# Patient Record
Sex: Female | Born: 1985 | Race: White | Hispanic: No | Marital: Single | Smoking: Current every day smoker
Health system: Southern US, Community
[De-identification: ages and names within clinical notes are randomized; demographics above are authoritative.]

## PROBLEM LIST (undated history)

## (undated) DIAGNOSIS — F418 Other specified anxiety disorders: Secondary | ICD-10-CM

## (undated) DIAGNOSIS — F319 Bipolar disorder, unspecified: Secondary | ICD-10-CM

## (undated) DIAGNOSIS — F509 Eating disorder, unspecified: Secondary | ICD-10-CM

## (undated) HISTORY — PX: ANKLE SURGERY: SHX546

## (undated) HISTORY — PX: SPINE SURGERY: SHX786

---

## 2008-11-29 ENCOUNTER — Inpatient Hospital Stay (HOSPITAL_COMMUNITY): Admission: AD | Admit: 2008-11-29 | Discharge: 2008-12-01 | Payer: Self-pay | Admitting: Psychiatry

## 2008-11-29 ENCOUNTER — Ambulatory Visit: Payer: Self-pay | Admitting: Psychiatry

## 2008-11-29 ENCOUNTER — Emergency Department (HOSPITAL_COMMUNITY): Admission: EM | Admit: 2008-11-29 | Discharge: 2008-11-29 | Payer: Self-pay | Admitting: Emergency Medicine

## 2010-10-28 LAB — COMPREHENSIVE METABOLIC PANEL
ALT: 16 U/L (ref 0–35)
Alkaline Phosphatase: 47 U/L (ref 39–117)
BUN: 7 mg/dL (ref 6–23)
CO2: 36 mEq/L — ABNORMAL HIGH (ref 19–32)
GFR calc non Af Amer: 54 mL/min — ABNORMAL LOW (ref >60)
Glucose, Bld: 91 mg/dL (ref 70–99)
Potassium: 3 mEq/L — ABNORMAL LOW (ref 3.5–5.1)
Sodium: 141 mEq/L (ref 135–145)
Total Bilirubin: 1.7 mg/dL — ABNORMAL HIGH (ref 0.3–1.2)

## 2010-10-28 LAB — DIFFERENTIAL
Basophils Absolute: 0 10*3/uL (ref 0.0–0.1)
Basophils Relative: 0 % (ref 0–1)
Eosinophils Absolute: 0.1 10*3/uL (ref 0.0–0.7)
Neutro Abs: 3 10*3/uL (ref 1.7–7.7)
Neutrophils Relative %: 54 % (ref 43–77)

## 2010-10-28 LAB — RAPID URINE DRUG SCREEN, HOSP PERFORMED
Barbiturates: NOT DETECTED
Benzodiazepines: NOT DETECTED
Cocaine: NOT DETECTED

## 2010-10-28 LAB — CBC
HCT: 42.8 % (ref 36.0–46.0)
Hemoglobin: 14.7 g/dL (ref 12.0–15.0)
MCHC: 34.4 g/dL (ref 30.0–36.0)
RBC: 4.75 MIL/uL (ref 3.87–5.11)
RDW: 13.5 % (ref 11.5–15.5)

## 2010-10-28 LAB — URINALYSIS, ROUTINE W REFLEX MICROSCOPIC
Glucose, UA: NEGATIVE mg/dL
Hgb urine dipstick: NEGATIVE
Ketones, ur: 15 mg/dL — AB
Protein, ur: 100 mg/dL — AB
pH: 6 (ref 5.0–8.0)

## 2010-10-28 LAB — URINE MICROSCOPIC-ADD ON

## 2010-10-28 LAB — ETHANOL: Alcohol, Ethyl (B): <5 mg/dL (ref 0–10)

## 2010-10-28 LAB — PREGNANCY, URINE: Preg Test, Ur: NEGATIVE

## 2010-12-02 NOTE — H&P (Signed)
NAMEALELI, NAVEDO              ACCOUNT NO.:  [id number]   MEDICAL RECORD NO.:  [id number]          PATIENT TYPE:  IPS   LOCATION:  0303                          FACILITY:  [facility]   PHYSICIAN:  Jasmine Pang, M.D. DATE OF BIRTH:  03-26-1986   DATE OF ADMISSION:  11/29/2008  DATE OF DISCHARGE:                       PSYCHIATRIC ADMISSION ASSESSMENT   HISTORY OF PRESENT ILLNESS:  The patient is here with a history of an  eating disorder, for the past 5-6 months has been purging and using  laxatives.  Prior to that she was anorexic.  She states that she was  just getting very tired of her parents telling her to eat.  She is here  as her father wanted her to get some help.  She recently lost her mother  about 3 weeks ago.  Also lost her grandmother prior to that and trying  to cope with these losses.  She has never received any prior help for  this.  She knows that it is a problem.  She worries all the time, unable  to sleep due to thinking about food.  She denies that she wants to hurt  herself.  She states that she knows that life is a precious thing.   PAST PSYCHIATRIC HISTORY:  This is her first admission to the [facility].  She has had no prior therapy or has never been on any  medications.   SOCIAL HISTORY:  [age]-year-old female.  She lives with her father.  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   FAMILY HISTORY:  None.   ALCOHOL OR DRUG HISTORY:  She denies any alcohol or drug use.   PRIMARY CARE Alechia Lezama:  None listed.   MEDICAL PROBLEMS:  No acute or chronic health issues.   MEDICATIONS:  None.   DRUG ALLERGIES:  No known allergies.   PHYSICAL EXAM:  This is a healthy-appearing, young female fully assessed  at [facility] Emergency Department.  Her temperature is 98.2, 88 heart rate, 16 respirations, blood pressure  is 117/78.   LABORATORY DATA:  Shows alcohol level less than 5.  Urine drug screen  is  negative.  Urine pregnancy test is negative.  CBC within normal limits.  Potassium is 3.  Urine showed an orange and cloudy appearance with  positive nitrites and positive protein.   MENTAL STATUS EXAM:  This is a fully alert female.  She is cooperative  with good eye-contact, appropriately dressed.  Her speech is clear,  normal pace and tone.  She provides a good history of circumstances that  surrounded this admission.  She seems very sincere.  Thought processes  are coherent and goal directed.  Cognitive function intact.  Her memory  is good.  Judgment and insight appear to be good.   AXIS I:  Eating disorder.  AXIS II:  Deferred.  AXIS III:  No known medical conditions.  AXIS IV:  Problems with education, psychosocial problems, grief.  AXIS V:  Current 45-50.   We will have a family session with her father as soon as possible.  The  patient could benefit from some individual therapy or going to a  residential treatment center.  Will continue to review comorbidities.  The patient may benefit from an antidepressant.  Will have Ambien 5 mg  available for sleep.  We encouraged fluids.  We ordered Gatorade.  The  patient will be in the blue group.  We will continue to, again, provide  a emotional assurance.  Tentative length of stay at this time is three  days.      Landry Corporal, N.P.      Jasmine Pang, M.D.  Electronically Signed    JO/MEDQ  D:  11/30/2008  T:  11/30/2008  Job:  [id number]

## 2010-12-05 NOTE — Discharge Summary (Signed)
Michelle Juarez, Michelle Juarez              ACCOUNT NO.:  [id number]   MEDICAL RECORD NO.:  [id number]          PATIENT TYPE:  IPS   LOCATION:  0303                          FACILITY:  [facility]   PHYSICIAN:  Jasmine Pang, M.D. DATE OF BIRTH:  1986/06/02   DATE OF ADMISSION:  11/29/2008  DATE OF DISCHARGE:  12/01/2008                               DISCHARGE SUMMARY   IDENTIFICATION:  This is the 25 year old single white female who was  admitted on a voluntary basis on Nov 29, 2008.   HISTORY OF PRESENT ILLNESS:  The patient has a history of bulimia and  laxative use.  Her father wanted her to get help and brought her to the  emergency room.  She is under stress because her grandmother and mother  passed away recently.  She has no prior psychiatric treatment and this  is her first [facility] admission.  For further admission information see  psychiatric admission assessment.   Upon admission, the patient was given the diagnoses on:  Axis I: Eating disorder, not otherwise specified; also depressive  disorder, not otherwise specified.  Axis III:  She was also given no disorder diagnosis.   PHYSICAL FINDINGS:  The patient was fully assessed in the [facility] ED  prior to admission here.  There were no acute physical or medical  problems noted.  Admission laboratories, alcohol level was less than  five.  UDS was negative.  Urine pregnancy test was negative.  CBC was  within normal limits.  Urine was cloudy with positive nitrites.   HOSPITAL COURSE:  Upon admission, the patient was started on Ambien 5 mg  p.o. q.h.s. p.r.n. may repeat x1.  She was also started on Ativan 0.5 mg  p.o. q.6 h. p.r.n. anxiety.  In addition, she was started on Gatorade 3-  4 glasses per day.  In individual sessions, the patient was casually  dressed with fair eye contact.  There was psychomotor retardation.  Speech was soft and slow.  Mood was depressed and anxious.  Affect was  consistent with mood.  There was no thought  disorder noted.  On Dec 01, 2008, the patient was anxious to go home.  She was going to have a  family session with her father and grandfather, and we agreed that she  would leave if the session went well.  She was less depressed and less  anxious.  Sleep was good.  The family session was held with our  counselor.  The patient father and grandfather to talk about discharge  plans.  After much discussion, the patient agreed to attend an inpatient  treatment facility for eating disorders.  She admits that she has had  symptoms of an eating disorder for 8 years.  She states she fell  controlled by the eating disorder for the past 3-4 months.  She has  recent grief issues of grandmother and mother dying.  The father and  grandfather felt strongly that she needed to be treated as an inpatient.  Father pointed out she continues to minimize the severity of her problem  and that she still thinks  she is not thin enough.  The patient became  tearful.  At this point, she did appear to have some insight about  positive coping skills like not looking in the mirror, staying away from  the scales, and staying with friends and family rather than being alone  to keep herself from purging.  The patient was stable and felt to be  safe for discharge to go to the inpatient treatment facility for eating  disorders.   DISCHARGE DIAGNOSES:  Axis I: Eating disorder not otherwise specified,  depressive disorder not otherwise specified, and anxiety disorder not  otherwise specified.  Axis II: None.  Axis III: None.  Axis IV: Severe problems with primary support group, recent loss of  mother, and recent loss of grandmother.  ( Also other psychosocial problems and burden of psychiatric illness).  Axis V: Global assessment of functioning was 55 upon discharge.  GAF was  45 upon admission.  GAF highest past year was 60-65.   DISCHARGE/PLAN:  There was no specific activity level or dietary  restriction.    POSTHOSPITAL CARE PLANS:  The patient will go to inpatient eating  disorders unit for follow up treatment for her eating disorder.   DISCHARGE MEDICATIONS:  1. Ambien 5 mg at bedtime as needed for insomnia.  2. Lorazepam 0.5 mg every 6 hours as needed for anxiety.      Jasmine Pang, M.D.  Electronically Signed     BHS/MEDQ  D:  12/03/2008  T:  12/04/2008  Job:  [id number]

## 2011-10-02 ENCOUNTER — Emergency Department (HOSPITAL_BASED_OUTPATIENT_CLINIC_OR_DEPARTMENT_OTHER)
Admission: EM | Admit: 2011-10-02 | Discharge: 2011-10-02 | Disposition: A | Discharge status: Home / Self Care | Payer: BC Managed Care – PPO | Attending: Emergency Medicine | Admitting: Emergency Medicine

## 2011-10-02 ENCOUNTER — Encounter (HOSPITAL_BASED_OUTPATIENT_CLINIC_OR_DEPARTMENT_OTHER): Payer: Self-pay | Admitting: Emergency Medicine

## 2011-10-02 DIAGNOSIS — Z9889 Other specified postprocedural states: Secondary | ICD-10-CM | POA: Insufficient documentation

## 2011-10-02 DIAGNOSIS — Z87891 Personal history of nicotine dependence: Secondary | ICD-10-CM | POA: Insufficient documentation

## 2011-10-02 DIAGNOSIS — K1370 Unspecified lesions of oral mucosa: Secondary | ICD-10-CM | POA: Insufficient documentation

## 2011-10-02 DIAGNOSIS — K1379 Other lesions of oral mucosa: Secondary | ICD-10-CM

## 2011-10-02 HISTORY — DX: Eating disorder, unspecified: F50.9

## 2011-10-02 MED ORDER — CLINDAMYCIN HCL 150 MG PO CAPS: 150.0000 mg | ORAL_CAPSULE | Freq: Four times a day (QID) | ORAL | Status: AC

## 2011-10-02 NOTE — ED Provider Notes (Signed)
History     CSN: [id number]  Arrival date & time 10/02/11  1333   First MD Initiated Contact with Patient 10/02/11 1402      Chief Complaint  Patient presents with  . Mouth Lesions    (Consider location/radiation/quality/duration/timing/severity/associated sxs/prior treatment) HPI Comments: Pt states that she had surgery 2 days ago on her ankle and she woke up this morning and noticed that she had some swelling to her left upper mouth with pain to the area  Patient is a 26 y.o. female presenting with mouth sores. The history is provided by the patient. No language interpreter was used.  Mouth Lesions  The current episode started today. The problem occurs continuously. The problem has been unchanged. The problem is moderate. Associated symptoms include headaches and mouth sores.    Past Medical History  Diagnosis Date  . Eating disorder     Past Surgical History  Procedure Date  . Ankle surgery     No family history on file.  History  Substance Use Topics  . Smoking status: Former Games developer  . Smokeless tobacco: Not on file  . Alcohol Use: No    OB History    Grav Para Term Preterm Abortions TAB SAB Ect Mult Living                  Review of Systems  Constitutional: Negative.   HENT: Positive for mouth sores.   Respiratory: Negative.   Cardiovascular: Negative.   Neurological: Positive for headaches.    Allergies  Review of patient's allergies indicates not on file.  Home Medications   Current Outpatient Rx  Name Route Sig Dispense Refill  . FUROSEMIDE 20 MG PO TABS Oral Take 20 mg by mouth 2 (two) times daily.    Marland Kitchen GABAPENTIN 400 MG PO CAPS Oral Take 400 mg by mouth 4 (four) times daily.    . SERTRALINE HCL 25 MG PO TABS Oral Take 25 mg by mouth daily.    Marland Kitchen SPIRONOLACTONE 25 MG PO TABS Oral Take 25 mg by mouth daily.    . TRAZODONE HCL 100 MG PO TABS Oral Take 100 mg by mouth at bedtime.      BP 111/70  Pulse 80  Temp(Src) 98.3 F (36.8 C) (Oral)   Resp 18  SpO2 97%  Physical Exam  Nursing note and vitals reviewed. Constitutional: She appears well-developed and well-nourished.  HENT:       Pt has swelling noted to the left upper palate  Eyes: Conjunctivae and EOM are normal.  Cardiovascular: Normal rate and regular rhythm.   Pulmonary/Chest: Effort normal and breath sounds normal. No respiratory distress.    ED Course  Procedures (including critical care time)  Labs Reviewed - No data to display No results found.   1. Mouth sore       MDM  Will treat for possible beginning of abscess:pt was unsure of intubation        Teressa Lower, NP 10/02/11 1429

## 2011-10-02 NOTE — ED Notes (Addendum)
Pt. Doesn't know the name of the antibiotic she is taking, but father says it is a green tablet. Pt. States she is in [place].

## 2011-10-02 NOTE — ED Notes (Signed)
Pt. C/o roof of mouth there is a lump. States she had foot surgery 2 days ago and is taking an antibiotic and hydro-codon. Mouth is painful in that specific area. Denies itching. Pt. States pressure in her head as well

## 2011-10-04 NOTE — ED Provider Notes (Signed)
Medical screening examination/treatment/procedure(s) were performed by non-physician practitioner and as supervising physician I was immediately available for consultation/collaboration.   Steffani Dionisio, MD 10/04/11 2123 

## 2012-10-10 ENCOUNTER — Encounter (HOSPITAL_COMMUNITY): Payer: Self-pay | Admitting: Nurse Practitioner

## 2012-10-10 ENCOUNTER — Emergency Department (HOSPITAL_COMMUNITY)
Admission: EM | Admit: 2012-10-10 | Discharge: 2012-10-10 | Disposition: A | Discharge status: Home / Self Care | Payer: BC Managed Care – PPO | Attending: Emergency Medicine | Admitting: Emergency Medicine

## 2012-10-10 DIAGNOSIS — Z8659 Personal history of other mental and behavioral disorders: Secondary | ICD-10-CM | POA: Insufficient documentation

## 2012-10-10 DIAGNOSIS — F319 Bipolar disorder, unspecified: Secondary | ICD-10-CM | POA: Insufficient documentation

## 2012-10-10 DIAGNOSIS — F419 Anxiety disorder, unspecified: Secondary | ICD-10-CM

## 2012-10-10 DIAGNOSIS — F4522 Body dysmorphic disorder: Secondary | ICD-10-CM

## 2012-10-10 DIAGNOSIS — E876 Hypokalemia: Secondary | ICD-10-CM | POA: Insufficient documentation

## 2012-10-10 DIAGNOSIS — F4521 Hypochondriasis: Secondary | ICD-10-CM | POA: Insufficient documentation

## 2012-10-10 DIAGNOSIS — Z3202 Encounter for pregnancy test, result negative: Secondary | ICD-10-CM | POA: Insufficient documentation

## 2012-10-10 DIAGNOSIS — Z79899 Other long term (current) drug therapy: Secondary | ICD-10-CM | POA: Insufficient documentation

## 2012-10-10 DIAGNOSIS — F411 Generalized anxiety disorder: Secondary | ICD-10-CM | POA: Insufficient documentation

## 2012-10-10 DIAGNOSIS — F17200 Nicotine dependence, unspecified, uncomplicated: Secondary | ICD-10-CM | POA: Insufficient documentation

## 2012-10-10 HISTORY — DX: Other specified anxiety disorders: F41.8

## 2012-10-10 HISTORY — DX: Bipolar disorder, unspecified: F31.9

## 2012-10-10 LAB — BASIC METABOLIC PANEL
BUN: 5 mg/dL — ABNORMAL LOW (ref 6–23)
CO2: 34 mEq/L — ABNORMAL HIGH (ref 19–32)
Calcium: 9.4 mg/dL (ref 8.4–10.5)
Glucose, Bld: 91 mg/dL (ref 70–99)
Sodium: 137 mEq/L (ref 135–145)

## 2012-10-10 LAB — URINALYSIS, ROUTINE W REFLEX MICROSCOPIC
Bilirubin Urine: NEGATIVE
Glucose, UA: NEGATIVE mg/dL
Hgb urine dipstick: NEGATIVE
Nitrite: NEGATIVE
Specific Gravity, Urine: 1.013 (ref 1.005–1.030)
pH: 8.5 — ABNORMAL HIGH (ref 5.0–8.0)

## 2012-10-10 LAB — RAPID URINE DRUG SCREEN, HOSP PERFORMED
Barbiturates: NOT DETECTED
Benzodiazepines: NOT DETECTED
Cocaine: NOT DETECTED
Opiates: NOT DETECTED

## 2012-10-10 LAB — CBC
HCT: 36.9 % (ref 36.0–46.0)
Hemoglobin: 12.2 g/dL (ref 12.0–15.0)
MCH: 28.1 pg (ref 26.0–34.0)
RBC: 4.34 MIL/uL (ref 3.87–5.11)

## 2012-10-10 LAB — URINE MICROSCOPIC-ADD ON

## 2012-10-10 MED ORDER — POTASSIUM CHLORIDE CRYS ER 20 MEQ PO TBCR
40.0000 meq | EXTENDED_RELEASE_TABLET | Freq: Once | ORAL | Status: AC
  Administered 2012-10-10: 40 meq via ORAL
  Filled 2012-10-10: qty 2

## 2012-10-10 MED ORDER — SODIUM CHLORIDE 0.9 % IV BOLUS (SEPSIS): 1000.0000 mL | Freq: Once | INTRAVENOUS | Status: DC

## 2012-10-10 MED ORDER — POTASSIUM CHLORIDE 10 MEQ/100ML IV SOLN: 10.0000 meq | Freq: Once | INTRAVENOUS | Status: DC

## 2012-10-10 MED ORDER — POTASSIUM CHLORIDE ER 10 MEQ PO TBCR: 20.0000 meq | EXTENDED_RELEASE_TABLET | Freq: Two times a day (BID) | ORAL | Status: DC

## 2012-10-10 NOTE — ED Notes (Signed)
YNW:[id number]<[id number]> Expected date:<BR> Expected time:<BR> Means of arrival:<BR> Comments:<BR> EMS - Syncope **[id number]**

## 2012-10-10 NOTE — ED Provider Notes (Signed)
Michelle Juarez S 8:00 PM patient discussed in sign out with [employer]. Patient with known psychiatric disorders presents to the emergency room from therapist's office for concerns of altered behavior and mental status change. Patient states that she was just feeling in a bad mood and did not lose consciousness and all are heard other people trying to talk to her and arouse her but she did not feel like talking. She also states that she has been very tired due to poor sleep and this had caused her to want to lay down and rest at the office. Currently she is awake and alert calm and appropriate. She has no complaints.  8:30 PM patient found to have low potassium of 2.6. She has history of hypokalemia in the past as well related to her anorexia and bulimia eating disorders. Potassium ordered. No significant EKG changes and no U waves. Patient's labs otherwise unremarkable. Patient has refused IV potassium states that she does not like this from experiences in the past. Patient has been given oral potassium supplements. We'll also continue oral potassium at home for the next several days. She advised to call PCP to recheck potassium level later this week.       Date: 10/10/2012  Rate: 70  Rhythm: normal sinus rhythm  QRS Axis: normal  Intervals: normal  ST/T Wave abnormalities: nonspecific ST/T changes  Conduction Disutrbances:none  Narrative Interpretation:   Old EKG Reviewed: none available    Angus Seller, PA-C 10/10/12 2109

## 2012-10-10 NOTE — ED Notes (Addendum)
Per EMS:  Pt was in therapy and fell asleep in fetal position; staff reported that they were unable to wake pt, however, pt states that she was sleeping. Speech slurred initially; slurred speech resolved and slow speech ensued en route; pt alert and oriented x 4 per EMS. Pt denies falling.  EKG unremarkable.  Pt reported that she took Klonipin this morning as prescribed.  BP 104/80, HR 100, 100%, CBG 93.  20g in left hand.  Addendum:  Pt stated that she took 1mg   Klonopin at 8 AM this morning as prescribed (as needed).  Pt states that she was feeling anxious this morning because she was going to see her therapist, Lurena Joiner, for body fat measurements; pt talked with therapist and decided to do measurements tomorrow.  Pt has not had a period in years due to eating disorder. Pt had a pair of tweezers and she was picking at a callous on hand; tweezer removed from pt with her permission. Left thumb is taped up due to accident 3 days ago from cutting vegetables.    Discussed the above with Jaci Carrel, PA and pt.

## 2012-10-10 NOTE — ED Provider Notes (Signed)
History     CSN: [id number]  Arrival date & time 10/10/12  1753   First MD Initiated Contact with Patient 10/10/12 1826      Chief Complaint  Patient presents with  . Altered Mental Status    (Consider location/radiation/quality/duration/timing/severity/associated sxs/prior treatment) HPI Comments: Michelle Juarez is a 27 y.o. female with a history of depression and anorexia presents emergency department from her therapist office after having an acute episode of altered mental status.  Per EMS patient was sent from [facility] for evaluation of altered mental status.  Patient was found in fetal position with slurred speech that has since resolved.  Patient reports taking 1 mg of Klonopin this morning prior to her therapist appointment because she becomes anxious before these meetings.  Prior to the event patient reports that she just exposed herself emotionally revealing personal things to therapist that became too hard to deal with emotionally.  Patient then walked away and sat in fetal position to fall asleep.  Patient reports that she has a followup appointment with her therapist tomorrow at 11 a.m. and that her psychiatrist following that as well as another therapy appointment scheduled for Thursday 11 a.m.  Patient denies any current suicidal ideations, plan or past attempts.  Patient states that she just wants to go home.  No other complaints at this time.  The history is provided by the patient.    Past Medical History  Diagnosis Date  . Eating disorder   . Depression with anxiety   . Bipolar 1 disorder     Past Surgical History  Procedure Laterality Date  . Ankle surgery      History reviewed. No pertinent family history.  History  Substance Use Topics  . Smoking status: Current Every Day Smoker -- 0.25 packs/day  . Smokeless tobacco: Never Used  . Alcohol Use: No    OB History   Grav Para Term Preterm Abortions TAB SAB Ect Mult Living            Review of Systems  All other systems reviewed and are negative.    Allergies  Review of patient's allergies indicates no known allergies.  Home Medications   Current Outpatient Rx  Name  Route  Sig  Dispense  Refill  . Amphetamine-Dextroamphetamine (AMPHETAMINE SALT COMBO PO)   Oral   Take 25 mg by mouth daily.         . furosemide (LASIX) 20 MG tablet   Oral   Take 20 mg by mouth 2 (two) times daily.         Marland Kitchen gabapentin (NEURONTIN) 400 MG capsule   Oral   Take 400 mg by mouth 4 (four) times daily.         . sertraline (ZOLOFT) 100 MG tablet   Oral   Take 200 mg by mouth daily.           BP 106/75  Pulse 71  Temp(Src) 98.3 F (36.8 C) (Oral)  Resp 17  SpO2 100%  Physical Exam  Nursing note and vitals reviewed. Constitutional: She is oriented to person, place, and time. She appears well-developed. No distress.  Patient not in visible distress, very thin appearing   HENT:  Head: Normocephalic and atraumatic.  Eyes: Conjunctivae and EOM are normal. Pupils are equal, round, and reactive to light.  Neck: Normal range of motion. Neck supple. Normal carotid pulses, no hepatojugular reflux and no JVD present. Carotid bruit is not present.  No carotid bruits  Cardiovascular:  RRR, no aberrancy on auscultation, intact distal pulses no pitting edema bilaterally.  Pulmonary/Chest: Effort normal and breath sounds normal. No respiratory distress.  Lungs clear to auscultation bilaterally  Abdominal: Soft. She exhibits no distension. There is no tenderness.  Nonpulsatile aorta  Musculoskeletal: Normal range of motion. She exhibits no tenderness.  Neurological: She is alert and oriented to person, place, and time. She displays a negative Romberg sign.  Cranial nerves III through XII intact, normal coordination, negative Romberg, strength 5/5 bilaterally. No ataxia  Skin: Skin is warm and dry. No pallor.  Psychiatric: She has a normal mood and affect. Her  behavior is normal.    ED Course  Procedures (including critical care time)  Labs Reviewed - No data to display No results found.   No diagnosis found.    MDM  Momentary alt mental status with possible etiology of emotional stress  Pt sent to ER from [facility] after being found in fetal position w difficulty arousing.  No focal deficits on exam.  Labs and and ECG ordered. Pt evaluated by  [facility] who has had her sign a contract for safety.  Suicide risk estimation is low patient denies any suicidal ideations has no past attempts and is currently being evaluated by both a therapist and a psychiatrist of which she is scheduled to see tomorrow.  As patient has appropriate followup I anticipate likely discharge if labs and ECG result without acute abnormalities.  Care resumed by oncoming provider.        Jaci Carrel, [place] 10/10/12 2004

## 2012-10-11 NOTE — ED Provider Notes (Signed)
Medical screening examination/treatment/procedure(s) were performed by non-physician practitioner and as supervising physician I was immediately available for consultation/collaboration.  Toy Baker, MD 10/11/12 [phone]

## 2012-10-11 NOTE — ED Provider Notes (Signed)
Medical screening examination/treatment/procedure(s) were performed by non-physician practitioner and as supervising physician I was immediately available for consultation/collaboration.  Danashia Landers T Undrea Archbold, MD 10/11/12 1543 

## 2012-10-12 LAB — URINE CULTURE

## 2016-02-13 ENCOUNTER — Emergency Department (HOSPITAL_COMMUNITY): Payer: BLUE CROSS/BLUE SHIELD

## 2016-02-13 ENCOUNTER — Inpatient Hospital Stay (HOSPITAL_COMMUNITY)
Admission: EM | Admit: 2016-02-13 | Discharge: 2016-02-25 | DRG: 917 | Disposition: A | Discharge status: Other Acute Inpatient Hospital | Payer: BLUE CROSS/BLUE SHIELD | Attending: Internal Medicine | Admitting: Internal Medicine

## 2016-02-13 ENCOUNTER — Encounter (HOSPITAL_COMMUNITY): Payer: Self-pay | Admitting: *Deleted

## 2016-02-13 DIAGNOSIS — F4310 Post-traumatic stress disorder, unspecified: Secondary | ICD-10-CM | POA: Diagnosis present

## 2016-02-13 DIAGNOSIS — F17210 Nicotine dependence, cigarettes, uncomplicated: Secondary | ICD-10-CM | POA: Diagnosis present

## 2016-02-13 DIAGNOSIS — Z915 Personal history of self-harm: Secondary | ICD-10-CM | POA: Diagnosis not present

## 2016-02-13 DIAGNOSIS — Z681 Body mass index (BMI) 19 or less, adult: Secondary | ICD-10-CM

## 2016-02-13 DIAGNOSIS — T426X4A Poisoning by other antiepileptic and sedative-hypnotic drugs, undetermined, initial encounter: Secondary | ICD-10-CM | POA: Diagnosis not present

## 2016-02-13 DIAGNOSIS — R402112 Coma scale, eyes open, never, at arrival to emergency department: Secondary | ICD-10-CM | POA: Diagnosis present

## 2016-02-13 DIAGNOSIS — F319 Bipolar disorder, unspecified: Secondary | ICD-10-CM | POA: Diagnosis present

## 2016-02-13 DIAGNOSIS — J9601 Acute respiratory failure with hypoxia: Secondary | ICD-10-CM | POA: Diagnosis present

## 2016-02-13 DIAGNOSIS — D649 Anemia, unspecified: Secondary | ICD-10-CM | POA: Diagnosis present

## 2016-02-13 DIAGNOSIS — J9600 Acute respiratory failure, unspecified whether with hypoxia or hypercapnia: Secondary | ICD-10-CM | POA: Diagnosis present

## 2016-02-13 DIAGNOSIS — K5900 Constipation, unspecified: Secondary | ICD-10-CM | POA: Diagnosis not present

## 2016-02-13 DIAGNOSIS — M542 Cervicalgia: Secondary | ICD-10-CM | POA: Diagnosis not present

## 2016-02-13 DIAGNOSIS — Z978 Presence of other specified devices: Secondary | ICD-10-CM

## 2016-02-13 DIAGNOSIS — R402212 Coma scale, best verbal response, none, at arrival to emergency department: Secondary | ICD-10-CM | POA: Diagnosis present

## 2016-02-13 DIAGNOSIS — T6591XA Toxic effect of unspecified substance, accidental (unintentional), initial encounter: Principal | ICD-10-CM | POA: Diagnosis present

## 2016-02-13 DIAGNOSIS — Z79899 Other long term (current) drug therapy: Secondary | ICD-10-CM

## 2016-02-13 DIAGNOSIS — Z888 Allergy status to other drugs, medicaments and biological substances status: Secondary | ICD-10-CM | POA: Diagnosis not present

## 2016-02-13 DIAGNOSIS — F5000 Anorexia nervosa, unspecified: Secondary | ICD-10-CM | POA: Diagnosis not present

## 2016-02-13 DIAGNOSIS — F509 Eating disorder, unspecified: Secondary | ICD-10-CM | POA: Diagnosis present

## 2016-02-13 DIAGNOSIS — R402312 Coma scale, best motor response, none, at arrival to emergency department: Secondary | ICD-10-CM | POA: Diagnosis present

## 2016-02-13 DIAGNOSIS — R4182 Altered mental status, unspecified: Secondary | ICD-10-CM | POA: Diagnosis present

## 2016-02-13 DIAGNOSIS — G9340 Encephalopathy, unspecified: Secondary | ICD-10-CM | POA: Diagnosis not present

## 2016-02-13 DIAGNOSIS — T50902D Poisoning by unspecified drugs, medicaments and biological substances, intentional self-harm, subsequent encounter: Secondary | ICD-10-CM | POA: Diagnosis not present

## 2016-02-13 DIAGNOSIS — F418 Other specified anxiety disorders: Secondary | ICD-10-CM | POA: Diagnosis present

## 2016-02-13 DIAGNOSIS — Z791 Long term (current) use of non-steroidal anti-inflammatories (NSAID): Secondary | ICD-10-CM | POA: Diagnosis not present

## 2016-02-13 DIAGNOSIS — G92 Toxic encephalopathy: Secondary | ICD-10-CM | POA: Diagnosis present

## 2016-02-13 DIAGNOSIS — Z8659 Personal history of other mental and behavioral disorders: Secondary | ICD-10-CM

## 2016-02-13 DIAGNOSIS — F331 Major depressive disorder, recurrent, moderate: Secondary | ICD-10-CM | POA: Diagnosis not present

## 2016-02-13 DIAGNOSIS — T1491 Suicide attempt: Secondary | ICD-10-CM | POA: Diagnosis not present

## 2016-02-13 DIAGNOSIS — J9811 Atelectasis: Secondary | ICD-10-CM | POA: Diagnosis present

## 2016-02-13 DIAGNOSIS — G629 Polyneuropathy, unspecified: Secondary | ICD-10-CM | POA: Diagnosis present

## 2016-02-13 DIAGNOSIS — F1010 Alcohol abuse, uncomplicated: Secondary | ICD-10-CM | POA: Diagnosis not present

## 2016-02-13 DIAGNOSIS — D72829 Elevated white blood cell count, unspecified: Secondary | ICD-10-CM | POA: Diagnosis present

## 2016-02-13 DIAGNOSIS — T50901A Poisoning by unspecified drugs, medicaments and biological substances, accidental (unintentional), initial encounter: Secondary | ICD-10-CM

## 2016-02-13 DIAGNOSIS — F411 Generalized anxiety disorder: Secondary | ICD-10-CM | POA: Diagnosis not present

## 2016-02-13 DIAGNOSIS — T50902A Poisoning by unspecified drugs, medicaments and biological substances, intentional self-harm, initial encounter: Secondary | ICD-10-CM | POA: Diagnosis not present

## 2016-02-13 DIAGNOSIS — T50904A Poisoning by unspecified drugs, medicaments and biological substances, undetermined, initial encounter: Secondary | ICD-10-CM | POA: Diagnosis not present

## 2016-02-13 DIAGNOSIS — F4312 Post-traumatic stress disorder, chronic: Secondary | ICD-10-CM | POA: Diagnosis not present

## 2016-02-13 LAB — I-STAT ARTERIAL BLOOD GAS, ED
ACID-BASE DEFICIT: 3 mmol/L — AB (ref 0.0–2.0)
Acid-base deficit: 4 mmol/L — ABNORMAL HIGH (ref 0.0–2.0)
Bicarbonate: 23.7 mEq/L (ref 20.0–24.0)
Bicarbonate: 22.6 mEq/L (ref 20.0–24.0)
O2 SAT: 99 %
O2 Saturation: 84 %
PH ART: 7.32 — AB (ref 7.350–7.450)
TCO2: 25 mmol/L (ref 0–100)
TCO2: 24 mmol/L (ref 0–100)
pCO2 arterial: 48.2 mmHg — ABNORMAL HIGH (ref 35.0–45.0)
pCO2 arterial: 43.5 mmHg (ref 35.0–45.0)
pH, Arterial: 7.299 — ABNORMAL LOW (ref 7.350–7.450)
pO2, Arterial: 141 mmHg — ABNORMAL HIGH (ref 80.0–100.0)
pO2, Arterial: 51 mmHg — ABNORMAL LOW (ref 80.0–100.0)

## 2016-02-13 LAB — RAPID URINE DRUG SCREEN, HOSP PERFORMED
Amphetamines: NOT DETECTED
BARBITURATES: NOT DETECTED
Benzodiazepines: NOT DETECTED
Cocaine: NOT DETECTED
Opiates: NOT DETECTED
Tetrahydrocannabinol: NOT DETECTED

## 2016-02-13 LAB — URINALYSIS, ROUTINE W REFLEX MICROSCOPIC
BILIRUBIN URINE: NEGATIVE
GLUCOSE, UA: NEGATIVE mg/dL
KETONES UR: NEGATIVE mg/dL
NITRITE: NEGATIVE
PH: 7 (ref 5.0–8.0)
Protein, ur: NEGATIVE mg/dL
Specific Gravity, Urine: 1.008 (ref 1.005–1.030)

## 2016-02-13 LAB — ACETAMINOPHEN LEVEL: Acetaminophen (Tylenol), Serum: <10 ug/mL — ABNORMAL LOW (ref 10–30)

## 2016-02-13 LAB — CBC WITH DIFFERENTIAL/PLATELET
Basophils Absolute: 0 10*3/uL (ref 0.0–0.1)
Basophils Relative: 0 %
Eosinophils Absolute: 0.2 10*3/uL (ref 0.0–0.7)
Eosinophils Relative: 2 %
HCT: 35 % — ABNORMAL LOW (ref 36.0–46.0)
Hemoglobin: 11.2 g/dL — ABNORMAL LOW (ref 12.0–15.0)
Lymphocytes Relative: 23 %
Lymphs Abs: 2 10*3/uL (ref 0.7–4.0)
MCH: 28.4 pg (ref 26.0–34.0)
MCHC: 32 g/dL (ref 30.0–36.0)
MCV: 88.6 fL (ref 78.0–100.0)
Monocytes Absolute: 1.1 10*3/uL — ABNORMAL HIGH (ref 0.1–1.0)
Monocytes Relative: 12 %
Neutro Abs: 5.4 10*3/uL (ref 1.7–7.7)
Neutrophils Relative %: 63 %
Platelets: 224 10*3/uL (ref 150–400)
RBC: 3.95 MIL/uL (ref 3.87–5.11)
RDW: 15.5 % (ref 11.5–15.5)
WBC: 8.6 10*3/uL (ref 4.0–10.5)

## 2016-02-13 LAB — URINE MICROSCOPIC-ADD ON

## 2016-02-13 LAB — TRIGLYCERIDES: Triglycerides: 138 mg/dL (ref <150)

## 2016-02-13 LAB — COMPREHENSIVE METABOLIC PANEL
ALT: 14 U/L (ref 14–54)
AST: 21 U/L (ref 15–41)
Albumin: 3 g/dL — ABNORMAL LOW (ref 3.5–5.0)
Alkaline Phosphatase: 44 U/L (ref 38–126)
Anion gap: 4 — ABNORMAL LOW (ref 5–15)
BUN: 6 mg/dL (ref 6–20)
CO2: 24 mmol/L (ref 22–32)
Calcium: 8.6 mg/dL — ABNORMAL LOW (ref 8.9–10.3)
Chloride: 109 mmol/L (ref 101–111)
Creatinine, Ser: 0.98 mg/dL (ref 0.44–1.00)
GFR calc Af Amer: >60 mL/min (ref >60)
GFR calc non Af Amer: >60 mL/min (ref >60)
Glucose, Bld: 71 mg/dL (ref 65–99)
Potassium: 3.5 mmol/L (ref 3.5–5.1)
Sodium: 137 mmol/L (ref 135–145)
Total Bilirubin: 0.2 mg/dL — ABNORMAL LOW (ref 0.3–1.2)
Total Protein: 5.4 g/dL — ABNORMAL LOW (ref 6.5–8.1)

## 2016-02-13 LAB — POC URINE PREG, ED: PREG TEST UR: NEGATIVE

## 2016-02-13 LAB — CBG MONITORING, ED: GLUCOSE-CAPILLARY: 81 mg/dL (ref 65–99)

## 2016-02-13 LAB — I-STAT CG4 LACTIC ACID, ED
LACTIC ACID, VENOUS: 1.95 mmol/L — AB (ref 0.5–1.9)
Lactic Acid, Venous: 1.83 mmol/L (ref 0.5–1.9)

## 2016-02-13 LAB — SALICYLATE LEVEL

## 2016-02-13 LAB — ETHANOL: ALCOHOL ETHYL (B): 77 mg/dL — AB (ref <5)

## 2016-02-13 MED ORDER — PROPOFOL 1000 MG/100ML IV EMUL
INTRAVENOUS | Status: AC
  Filled 2016-02-13: qty 100

## 2016-02-13 MED ORDER — PROPOFOL 1000 MG/100ML IV EMUL: 5.0000 ug/kg/min | Freq: Once | INTRAVENOUS | Status: DC

## 2016-02-13 MED ORDER — SODIUM CHLORIDE 0.9 % IV BOLUS (SEPSIS)
1000.0000 mL | Freq: Once | INTRAVENOUS | Status: AC
  Administered 2016-02-13: 1000 mL via INTRAVENOUS

## 2016-02-13 MED ORDER — NALOXONE HCL 0.4 MG/ML IJ SOLN: 0.4000 mg | Freq: Once | INTRAMUSCULAR | Status: DC

## 2016-02-13 MED ORDER — MIDAZOLAM HCL 2 MG/2ML IJ SOLN
2.0000 mg | Freq: Once | INTRAMUSCULAR | Status: AC
  Administered 2016-02-13: 2 mg via INTRAVENOUS

## 2016-02-13 MED ORDER — FENTANYL CITRATE (PF) 100 MCG/2ML IJ SOLN
100.0000 ug | Freq: Once | INTRAMUSCULAR | Status: AC
  Administered 2016-02-13: 100 ug via INTRAVENOUS

## 2016-02-13 MED ORDER — SODIUM CHLORIDE 0.9 % IV SOLN: 250.0000 mL | INTRAVENOUS | Status: DC | PRN

## 2016-02-13 MED ORDER — SODIUM CHLORIDE 0.9 % IV SOLN
INTRAVENOUS | Status: DC
  Administered 2016-02-13 – 2016-02-15 (×3): via INTRAVENOUS

## 2016-02-13 MED ORDER — KETAMINE HCL-SODIUM CHLORIDE 100-0.9 MG/10ML-% IV SOSY
1.5000 mg/kg | PREFILLED_SYRINGE | Freq: Once | INTRAVENOUS | Status: DC
  Filled 2016-02-13: qty 10

## 2016-02-13 MED ORDER — FENTANYL CITRATE (PF) 100 MCG/2ML IJ SOLN: 100.0000 ug | INTRAMUSCULAR | Status: DC | PRN

## 2016-02-13 MED ORDER — KETAMINE HCL 10 MG/ML IJ SOLN
INTRAMUSCULAR | Status: AC | PRN
  Administered 2016-02-13: 100 mg via INTRAVENOUS

## 2016-02-13 MED ORDER — MIDAZOLAM HCL 2 MG/2ML IJ SOLN
INTRAMUSCULAR | Status: AC
  Filled 2016-02-13: qty 2

## 2016-02-13 MED ORDER — PHENYLEPHRINE 40 MCG/ML (10ML) SYRINGE FOR IV PUSH (FOR BLOOD PRESSURE SUPPORT)
PREFILLED_SYRINGE | INTRAVENOUS | Status: AC
  Filled 2016-02-13: qty 10

## 2016-02-13 MED ORDER — FENTANYL CITRATE (PF) 100 MCG/2ML IJ SOLN
INTRAMUSCULAR | Status: AC
  Filled 2016-02-13: qty 2

## 2016-02-13 MED ORDER — NALOXONE HCL 2 MG/2ML IJ SOSY
PREFILLED_SYRINGE | INTRAMUSCULAR | Status: AC
  Filled 2016-02-13: qty 2

## 2016-02-13 MED ORDER — PANTOPRAZOLE SODIUM 40 MG PO PACK: 40.0000 mg | PACK | Freq: Every day | ORAL | Status: DC

## 2016-02-13 MED ORDER — NOREPINEPHRINE BITARTRATE 1 MG/ML IV SOLN
0.0000 ug/min | INTRAVENOUS | Status: DC
  Filled 2016-02-13: qty 4

## 2016-02-13 MED ORDER — PROPOFOL 1000 MG/100ML IV EMUL
5.0000 ug/kg/min | Freq: Once | INTRAVENOUS | Status: AC
  Administered 2016-02-13: 10 ug/kg/min via INTRAVENOUS

## 2016-02-13 MED ORDER — ROCURONIUM BROMIDE 50 MG/5ML IV SOLN
INTRAVENOUS | Status: AC | PRN
  Administered 2016-02-13: 50 mg via INTRAVENOUS

## 2016-02-13 MED ORDER — HEPARIN SODIUM (PORCINE) 5000 UNIT/ML IJ SOLN
5000.0000 [IU] | Freq: Three times a day (TID) | INTRAMUSCULAR | Status: DC
  Administered 2016-02-14 – 2016-02-17 (×10): 5000 [IU] via SUBCUTANEOUS
  Filled 2016-02-13 (×16): qty 1

## 2016-02-13 MED ORDER — FENTANYL CITRATE (PF) 2500 MCG/50ML IJ SOLN
25.0000 ug/h | INTRAMUSCULAR | Status: DC
  Administered 2016-02-13: 50 ug/h via INTRAVENOUS
  Filled 2016-02-13: qty 50

## 2016-02-13 MED ORDER — NALOXONE HCL 2 MG/2ML IJ SOSY
1.0000 mg | PREFILLED_SYRINGE | Freq: Once | INTRAMUSCULAR | Status: AC
  Administered 2016-02-13: 1 mg via INTRAVENOUS

## 2016-02-13 NOTE — ED Notes (Signed)
Pts father at bedside states, "[place] got [place] on [place]. On Sunday we left going to the beach and she didn't want to go. When we came back we  Had to call 911 because she was not responding. They committed her for 72  Hours and sent her to [facility] in [place] where she stayed for 3 days."

## 2016-02-13 NOTE — ED Provider Notes (Signed)
I saw and evaluated the patient, reviewed the resident's note and I agree with the findings and plan.   EKG Interpretation  Date/Time:  Thursday February 13 2016 16:25:57 EDT Ventricular Rate:  71 PR Interval:    QRS Duration: 91 QT Interval:  413 QTC Calculation: 449 R Axis:   80 Text Interpretation:  Sinus rhythm Confirmed by Juleen China  MD, Eriko Economos ([id number]) on 02/13/2016 7:33:18 PM      29yF with decreased mental status. Clinically suspect overdose/tox based on her past history and collateral information from her father. . Father reports wine coolers in her car and previously abuse of benzos.No clinical response to narcan  Doubt infectious. No reported trauma. CT head negative. She remained with a decreased GCS and absent gag and less responsive through ED stay. Increasing hypercapnea. Ultimately intubated. Please see Dr Danielle Rankin note as he was attending present for intubation. It this time she needs continued supportive care and time to metabolize.    CRITICAL CARE Performed by: Raeford Razor Total critical care time: 35 minutes Critical care time was exclusive of separately billable procedures and treating other patients. Critical care was necessary to treat or prevent imminent or life-threatening deterioration. Critical care was time spent personally by me on the following activities: development of treatment plan with patient and/or surrogate as well as nursing, discussions with consultants, evaluation of patient's response to treatment, examination of patient, obtaining history from patient or surrogate, ordering and performing treatments and interventions, ordering and review of laboratory studies, ordering and review of radiographic studies, pulse oximetry and re-evaluation of patient's condition.    Raeford Razor, MD 02/13/16 2037

## 2016-02-13 NOTE — Progress Notes (Signed)
RT NOTE:  ABG results reports to MD.

## 2016-02-13 NOTE — Progress Notes (Signed)
Patient intubated for airway protection without any apparent complications. RT will continue to monitor.

## 2016-02-13 NOTE — ED Notes (Signed)
Pt rcvd 1 mg Narcan per verbal order

## 2016-02-13 NOTE — ED Provider Notes (Signed)
[facility]-EMERGENCY DEPT Provider Note   CSN: [id number] Arrival date & time: 02/13/16  4098  First Provider Contact:  First MD Initiated Contact with Patient 02/13/16 1628     Level V caveat due to altered mental status.   History   Chief Complaint Chief Complaint  Patient presents with  . Altered Mental Status    HPI Michelle Juarez is a 30 y.o. female.  The history is provided by the EMS personnel and a relative.  Altered Mental Status   This is a new problem. The current episode started less than 1 hour ago. Associated symptoms include somnolence and unresponsiveness. Risk factors include alcohol intake and a change in prescription. Her past medical history does not include seizures.    Past Medical History:  Diagnosis Date  . Bipolar 1 disorder (HCC)   . Depression with anxiety   . Eating disorder     Patient Active Problem List   Diagnosis Date Noted  . Acute respiratory failure (HCC) 02/13/2016    Past Surgical History:  Procedure Laterality Date  . ANKLE SURGERY    . SPINE SURGERY      OB History    No data available       Home Medications    Prior to Admission medications   Medication Sig Start Date End Date Taking? Authorizing Provider  Amphetamine-Dextroamphetamine (AMPHETAMINE SALT COMBO PO) Take 30 mg by mouth daily.    Yes Historical Provider, MD  ciprofloxacin (CIPRO) 500 MG tablet Take 500 mg by mouth 2 (two) times daily. For 5 days 02/13/16 02/18/16 Yes Historical Provider, MD  clonazePAM (KLONOPIN) 2 MG tablet Take 2 mg by mouth 3 (three) times daily as needed for anxiety.  11/29/15  Yes Historical Provider, MD  cyclobenzaprine (FLEXERIL) 10 MG tablet Take 10 mg by mouth 3 (three) times daily as needed for muscle spasms.  12/02/15  Yes Historical Provider, MD  DULoxetine (CYMBALTA) 60 MG capsule Take 60 mg by mouth at bedtime. 10/01/15  Yes Historical Provider, MD  linaclotide (LINZESS) 145 MCG CAPS capsule Take 145 mcg by mouth daily. 07/02/15  Yes  Historical Provider, MD  Melatonin 3 MG TABS Take 3 mg by mouth at bedtime.   Yes Historical Provider, MD  meloxicam (MOBIC) 7.5 MG tablet Take 7.5 mg by mouth daily.   Yes Historical Provider, MD  ondansetron (ZOFRAN) 4 MG tablet Take 4 mg by mouth every 8 (eight) hours as needed. 02/13/16  Yes Historical Provider, MD  polyethylene glycol powder (GLYCOLAX/MIRALAX) powder Take 17 g by mouth 3 (three) times daily as needed. 02/13/16  Yes Historical Provider, MD  prazosin (MINIPRESS) 1 MG capsule Take 3 mg by mouth at bedtime. For 30 days 11/01/15  Yes Historical Provider, MD  promethazine (PHENERGAN) 25 MG tablet Take 25 mg by mouth every 6 (six) hours as needed for nausea or vomiting.   Yes Historical Provider, MD  QUEtiapine (SEROQUEL) 25 MG tablet Take 25 mg by mouth at bedtime.   Yes Historical Provider, MD  QUEtiapine (SEROQUEL) 400 MG tablet Take 400 mg by mouth at bedtime. 02/13/16  Yes Historical Provider, MD  ranitidine (ZANTAC) 150 MG tablet Take 150 mg by mouth daily as needed for heartburn.   Yes Historical Provider, MD  traMADol (ULTRAM) 50 MG tablet Take 50 mg by mouth every 8 (eight) hours as needed for moderate pain.  01/09/16  Yes Historical Provider, MD  traZODone (DESYREL) 100 MG tablet Take 100 mg by mouth at bedtime. 01/29/16  Yes Historical  Provider, MD  furosemide (LASIX) 20 MG tablet Take 20 mg by mouth 2 (two) times daily.    Historical Provider, MD  gabapentin (NEURONTIN) 400 MG capsule Take 400 mg by mouth 4 (four) times daily.    Historical Provider, MD  potassium chloride (K-DUR) 10 MEQ tablet Take 2 tablets (20 mEq total) by mouth 2 (two) times daily. 10/10/12   Ivonne Andrew, PA-C  sertraline (ZOLOFT) 100 MG tablet Take 200 mg by mouth daily.    Historical Provider, MD    Family History No family history on file.  Social History Social History  Substance Use Topics  . Smoking status: Current Every Day Smoker    Packs/day: 0.25    Types: Cigarettes  . Smokeless  tobacco: Never Used  . Alcohol use Yes     Comment: unknown amount     Allergies   Lyrica [pregabalin]   Review of Systems Review of Systems  Unable to perform ROS: Mental status change     Physical Exam Updated Vital Signs BP (!) 127/104   Pulse 73   Temp (!) 96.3 F (35.7 C) (Rectal)   Resp 20   Ht 5\' 7"  (1.702 m)   Wt 49.9 kg   LMP  (LMP Unknown)   SpO2 100%   BMI 17.23 kg/m   Physical Exam  Constitutional: She appears well-developed. She appears distressed.  HENT:  Head: Normocephalic and atraumatic.  Eyes: Conjunctivae and lids are normal. Pupils are equal, round, and reactive to light.  Neck: Neck supple.  Cardiovascular: Normal rate and regular rhythm.   No murmur heard. Pulmonary/Chest: Effort normal and breath sounds normal. No respiratory distress.  Abdominal: Soft. There is no tenderness.  Musculoskeletal: She exhibits no edema.  Neurological: She is alert. GCS eye subscore is 2. GCS verbal subscore is 2. GCS motor subscore is 5.  Skin: Skin is warm and dry.  Psychiatric: She has a normal mood and affect.  Nursing note and vitals reviewed.   ED Treatments / Results  Labs (all labs ordered are listed, but only abnormal results are displayed) Labs Reviewed  CBC WITH DIFFERENTIAL/PLATELET - Abnormal; Notable for the following:       Result Value   Hemoglobin 11.2 (*)    HCT 35.0 (*)    Monocytes Absolute 1.1 (*)    All other components within normal limits  COMPREHENSIVE METABOLIC PANEL - Abnormal; Notable for the following:    Calcium 8.6 (*)    Total Protein 5.4 (*)    Albumin 3.0 (*)    Total Bilirubin 0.2 (*)    Anion gap 4 (*)    All other components within normal limits  URINALYSIS, ROUTINE W REFLEX MICROSCOPIC (NOT AT [facility]) - Abnormal; Notable for the following:    Hgb urine dipstick LARGE (*)    Leukocytes, UA LARGE (*)    All other components within normal limits  ETHANOL - Abnormal; Notable for the following:    Alcohol, Ethyl (B)  77 (*)    All other components within normal limits  ACETAMINOPHEN LEVEL - Abnormal; Notable for the following:    Acetaminophen (Tylenol), Serum <10 (*)    All other components within normal limits  URINE MICROSCOPIC-ADD ON - Abnormal; Notable for the following:    Squamous Epithelial / LPF 0-5 (*)    Bacteria, UA FEW (*)    All other components within normal limits  I-STAT ARTERIAL BLOOD GAS, ED - Abnormal; Notable for the following:    pH, Arterial  7.299 (*)    pCO2 arterial 48.2 (*)    pO2, Arterial 141.0 (*)    Acid-base deficit 3.0 (*)    All other components within normal limits  I-STAT CG4 LACTIC ACID, ED - Abnormal; Notable for the following:    Lactic Acid, Venous 1.95 (*)    All other components within normal limits  I-STAT ARTERIAL BLOOD GAS, ED - Abnormal; Notable for the following:    pH, Arterial 7.320 (*)    pO2, Arterial 51.0 (*)    Acid-base deficit 4.0 (*)    All other components within normal limits  CULTURE, RESPIRATORY (NON-EXPECTORATED)  MRSA PCR SCREENING  URINE RAPID DRUG SCREEN, HOSP PERFORMED  SALICYLATE LEVEL  CBC  BASIC METABOLIC PANEL  BLOOD GAS, ARTERIAL  MAGNESIUM  PHOSPHORUS  CALCIUM, IONIZED  PROCALCITONIN  PROCALCITONIN  TRIGLYCERIDES  CBG MONITORING, ED  POC URINE PREG, ED  I-STAT CG4 LACTIC ACID, ED  I-STAT CG4 LACTIC ACID, ED    EKG  EKG Interpretation  Date/Time:  Thursday February 13 2016 16:25:57 EDT Ventricular Rate:  71 PR Interval:    QRS Duration: 91 QT Interval:  413 QTC Calculation: 449 R Axis:   80 Text Interpretation:  Sinus rhythm Confirmed by Juleen China  MD, STEPHEN ([id number]) on 02/13/2016 7:33:18 PM       Radiology Ct Head Wo Contrast  Result Date: 02/13/2016 CLINICAL DATA:  Acute onset of slurred speech and unresponsiveness EXAM: CT HEAD WITHOUT CONTRAST TECHNIQUE: Contiguous axial images were obtained from the base of the skull through the vertex without intravenous contrast. COMPARISON:  08/18/2014 FINDINGS: The  bony calvarium is intact. No findings to suggest acute hemorrhage, acute infarction or space-occupying mass lesion are noted. IMPRESSION: No acute intracranial abnormality noted. Electronically Signed   By: Alcide Clever M.D.   On: 02/13/2016 17:44  Dg Chest Port 1 View  Result Date: 02/13/2016 CLINICAL DATA:  Ventilator dependence. EXAM: PORTABLE CHEST 1 VIEW COMPARISON:  Earlier the same day FINDINGS: 1924 hours. Endotracheal tube tip is 4.2 cm above the base of the carina. Slight increase in bibasilar atelectasis with pneumonia not excluded at the left base on this exam. The cardiopericardial silhouette is within normal limits for size. The NG tube passes into the stomach although the distal tip position is not included on the film. Telemetry leads overlie the chest. IMPRESSION: Endotracheal tube tip 4.2 cm above the base of the carina. Slight increase in left base atelectasis with pneumonia not completely excluded by imaging. Electronically Signed   By: Kennith Center M.D.   On: 02/13/2016 19:33  Dg Chest Portable 1 View  Result Date: 02/13/2016 CLINICAL DATA:  Altered mental status. EXAM: PORTABLE CHEST 1 VIEW COMPARISON:  08/18/2014 FINDINGS: 1643 hours. Basilar atelectasis, right greater than left. No focal airspace consolidation or overt pulmonary edema. No pleural effusion. The cardiopericardial silhouette is within normal limits for size. Patient is status post T12-L2 fusion. Telemetry leads overlie the chest. IMPRESSION: Basilar atelectasis without acute findings. Electronically Signed   By: Kennith Center M.D.   On: 02/13/2016 16:52   Procedures Procedures (including critical care time)  Medications Ordered in ED Medications  ketamine 100 mg in normal saline 10 mL (10mg /mL) syringe (75 mg Intravenous Not Given 02/13/16 1902)  norepinephrine (LEVOPHED) 4 mg in dextrose 5 % 250 mL (0.016 mg/mL) infusion (not administered)  fentaNYL (SUBLIMAZE) 100 MCG/2ML injection (not administered)    midazolam (VERSED) 2 MG/2ML injection (not administered)  propofol (DIPRIVAN) 1000 MG/100ML infusion (40 mcg/kg/min  49.9 kg Intravenous Rate/Dose Verify 02/13/16 2225)  propofol (DIPRIVAN) 1000 MG/100ML infusion (not administered)  heparin injection 5,000 Units (not administered)  0.9 %  sodium chloride infusion (not administered)  0.9 %  sodium chloride infusion (not administered)  pantoprazole sodium (PROTONIX) 40 mg/20 mL oral suspension 40 mg (not administered)  fentaNYL (SUBLIMAZE) injection 100 mcg (not administered)  fentaNYL (SUBLIMAZE) injection 100 mcg (not administered)  fentaNYL (SUBLIMAZE) 2,500 mcg in sodium chloride 0.9 % 250 mL (10 mcg/mL) infusion (50 mcg/hr Intravenous New Bag/Given 02/13/16 2225)  naloxone (NARCAN) injection 1 mg (1 mg Intravenous Given 02/13/16 1635)  sodium chloride 0.9 % bolus 1,000 mL (0 mLs Intravenous Stopped 02/13/16 1756)  sodium chloride 0.9 % bolus 1,000 mL (1,000 mLs Intravenous New Bag/Given 02/13/16 1902)  ketamine (KETALAR) injection (100 mg Intravenous Given 02/13/16 1855)  rocuronium (ZEMURON) injection (50 mg Intravenous Given 02/13/16 1856)  fentaNYL (SUBLIMAZE) injection 100 mcg (100 mcg Intravenous Given 02/13/16 1937)  midazolam (VERSED) injection 2 mg (2 mg Intravenous Given 02/13/16 1937)  propofol (DIPRIVAN) 1000 MG/100ML infusion (10 mcg/kg/min  49.9 kg Intravenous New Bag/Given 02/13/16 2002)     Initial Impression / Assessment and Plan / ED Course  I have reviewed the triage vital signs and the nursing notes.  Pertinent labs & imaging results that were available during my care of the patient were reviewed by me and considered in my medical decision making (see chart for details).  Clinical Course    The pt is a 30 yo female presenting for altered mental status and unresponsiveness.  Per father she had additional similar episode previously for which she was hospitalized. Unknown what she had taken at that time. Report she became  somnolent while at a therapy appointment today. He found alcohol containers and her car. Unknown if she took additional medications.  On arrival the patient had decreased GCS for protecting airway initially. Narcan 1 mg x 2 given without alleviation.  BG WNL.  ECG display no interval changes or acute ischemic changes. CT head unremarkable. UDS, Tylenol, Salicylate, CBC, BMP unremarkable. UA with blood.  Pt had reports some blood in urine for several days per father and unknown etiology.  ABG with acidosis and hypercarbia. GCS continued to decline. Elected to intubate the patient.  Discussed with critical care who agree to admission. Low suspicion for infectious etiology or meningitis. Likely ingestion of unknown prescription or illicit substance.   Labs were viewed by myself and incorporated into medical decision making.  Discussed pertinent finding with patient or caregiver prior to admission with no further questions.  Pt care supervised by my attending Dr. Juleen China.   Tery Sanfilippo, MD PGY-3 Emergency Medicine   Final Clinical Impressions(s) / ED Diagnoses   Final diagnoses:  Endotracheally intubated  Altered mental status, unspecified altered mental status type    New Prescriptions Current Discharge Medication List       Tery Sanfilippo, MD 02/13/16 1610    Raeford Razor, MD 02/20/16 2216

## 2016-02-13 NOTE — ED Provider Notes (Signed)
  Physical Exam  BP 122/95   Pulse 87   Temp (!) 96.3 F (35.7 C) (Rectal)   Resp 17   Ht 5\' 7"  (1.702 m)   Wt 110 lb (49.9 kg)   LMP  (LMP Unknown)   SpO2 100%   BMI 17.23 kg/m   Physical Exam  Constitutional: She appears well-developed and well-nourished.  Cardiovascular: Normal rate.   Pulmonary/Chest: Effort normal and breath sounds normal. She has no wheezes. She has no rales.  Neurological: GCS eye subscore is 1. GCS verbal subscore is 1. GCS motor subscore is 1.  Skin: Skin is warm and dry.  Nursing note and vitals reviewed.   ED Course  .Intubation Date/Time: 02/14/2016 12:43 AM Performed by: Marily Memos Authorized by: Marily Memos   Consent:    Consent obtained:  Emergent situation and verbal   Consent given by:  Parent   Risks discussed:  Aspiration, brain injury and death   Alternatives discussed:  No treatment and observation Pre-procedure details:    Patient status:  Unresponsive   Mallampati score:  II   Pretreatment medications:  None   Paralytics:  None Procedure details:    Preoxygenation:  Nasal cannula   CPR in progress: no     Intubation method:  Oral   Oral intubation technique:  Video-assisted   Laryngoscope blade:  Mac 3   Tube size (mm):  7.5   Tube type:  Cuffed   Number of attempts:  1   Tube visualized through cords: yes   Placement assessment:    ETT to lip:  23   Tube secured with:  ETT holder   Breath sounds:  Equal and absent over the epigastrium   Placement verification: chest rise, condensation, direct visualization and equal breath sounds     CXR findings:  ETT in proper place Post-procedure details:    Patient tolerance of procedure:  Tolerated well, no immediate complications    MDM I was present for and supervised the intubation by the student only. I am not responsible or participated in medical decision making otherwise.        Marily Memos, MD 02/14/16 [phone]

## 2016-02-13 NOTE — H&P (Signed)
PULMONARY / CRITICAL CARE MEDICINE   Name: Michelle Juarez MRN: [id number] DOB: 04-30-1986    ADMISSION DATE:  02/13/2016 CONSULTATION DATE:  02/13/2016  REFERRING MD:  Dr Juleen China EDP  CHIEF COMPLAINT:  overdose  HISTORY OF PRESENT ILLNESS:   30 year old female with PMh as below, which includes bipolar, depression, eating disorder. She is adopted so family history is unknown. History obtained from father. She has struggled with eating disorders since college maybe even high school. Psychological issues have really amplified since her adoptive mother passed a few year ago. She has been in and out of rehabilitation facilities and has had 3 attempts at suicide. Father describes that she will take pills just before she knows people will get home. He thinks that she truly does not want to kill herself. They were at the beach a few weeks ago and stayed in while the family went on a boat. Upon returning she was unresponsive and treated as an overdose for 3 days. She was then transferred to rehab facility where she was kept for 3 days and discharged on 7/23. 7/27 she presented to ED after having AMS at counselor's office, she eventually lost consciousness and EMS was called. She was intubated upon arrival to ED for airway protection. It is unknown what substances she could have taken to overdose. ETOH level elevated at 77, UDS in ED was negative. Home medication list outlined below.   PAST MEDICAL HISTORY :  She  has a past medical history of Bipolar 1 disorder (HCC); Depression with anxiety; and Eating disorder.  PAST SURGICAL HISTORY: She  has a past surgical history that includes Ankle surgery and Spine surgery.  Allergies  Allergen Reactions  . Lyrica [Pregabalin] Other (See Comments)    Weight gain    No current facility-administered medications on file prior to encounter.    Current Outpatient Prescriptions on File Prior to Encounter  Medication Sig  . Amphetamine-Dextroamphetamine  (AMPHETAMINE SALT COMBO PO) Take 30 mg by mouth daily.   . furosemide (LASIX) 20 MG tablet Take 20 mg by mouth 2 (two) times daily.  Marland Kitchen gabapentin (NEURONTIN) 400 MG capsule Take 400 mg by mouth 4 (four) times daily.  . potassium chloride (K-DUR) 10 MEQ tablet Take 2 tablets (20 mEq total) by mouth 2 (two) times daily.  . sertraline (ZOLOFT) 100 MG tablet Take 200 mg by mouth daily.    FAMILY HISTORY:  Her has no family status information on file.    SOCIAL HISTORY: She  reports that she has been smoking Cigarettes.  She has been smoking about 0.25 packs per day. She has never used smokeless tobacco. She reports that she drinks alcohol. She reports that she does not use drugs.  REVIEW OF SYSTEMS:  Unable to obtain as pt is encephalopathic.   SUBJECTIVE:  On vent, slightly agitated.   VITAL SIGNS: BP (!) 137/103   Pulse 76   Temp (!) 96.3 F (35.7 C) (Rectal)   Resp 20   Ht  (1.702 m)   Wt 49.9 kg (110 lb)   LMP  (LMP Unknown)   SpO2 100%   BMI 17.23 kg/m   HEMODYNAMICS:    VENTILATOR SETTINGS: Vent Mode: PRVC FiO2 (%):  [100 %] 100 % Set Rate:  [18 bmp-20 bmp] 20 bmp Vt Set:  [490 mL] 490 mL PEEP:  [5 cmH20-8 cmH20] 8 cmH20 Plateau Pressure:  [12 cmH20-16 cmH20] 16 cmH20  INTAKE / OUTPUT: I/O last 3 completed shifts: In: 1900 [I.V.:1000;  Other:900] Out: -   PHYSICAL EXAMINATION: General:  Thin young female agitated on vent Neuro:  Agitated, not purposeful or following commands HEENT:  /AT, PERRL, no JVD Cardiovascular:  RRR, no MRG Lungs:  Clear bilateral breath sounds Abdomen:  Soft, non-distended Musculoskeletal:  No acute deformity, no edema Skin:  Grossly intact  LABS:  BMET  Recent Labs Lab 03-10-16 1650  NA 137  K 3.5  CL 109  CO2 24  BUN 6  CREATININE 0.98  GLUCOSE 71    Electrolytes  Recent Labs Lab 03/10/2016 1650  CALCIUM 8.6*    CBC  Recent Labs Lab 03/10/16 1650  WBC 8.6  HGB 11.2*  HCT 35.0*  PLT 224     Coag's No results for input(s): APTT, INR in the last 168 hours.  Sepsis Markers  Recent Labs Lab March 10, 2016 1650  LATICACIDVEN 1.95*    ABG  Recent Labs Lab 03/10/16 1649 10-Mar-2016 1948  PHART 7.299* 7.320*  PCO2ART 48.2* 43.5  PO2ART 141.0* 51.0*    Liver Enzymes  Recent Labs Lab 2016/03/10 1650  AST 21  ALT 14  ALKPHOS 44  BILITOT 0.2*  ALBUMIN 3.0*    Cardiac Enzymes No results for input(s): TROPONINI, PROBNP in the last 168 hours.  Glucose  Recent Labs Lab 03/10/2016 1625  GLUCAP 81    Imaging Ct Head Wo Contrast  Result Date: 03-10-16 CLINICAL DATA:  Acute onset of slurred speech and unresponsiveness EXAM: CT HEAD WITHOUT CONTRAST TECHNIQUE: Contiguous axial images were obtained from the base of the skull through the vertex without intravenous contrast. COMPARISON:  08/18/2014 FINDINGS: The bony calvarium is intact. No findings to suggest acute hemorrhage, acute infarction or space-occupying mass lesion are noted. IMPRESSION: No acute intracranial abnormality noted. Electronically Signed   By: Alcide Clever M.D.   On: Mar 10, 2016 17:44  Dg Chest Port 1 View  Result Date: Mar 10, 2016 CLINICAL DATA:  Ventilator dependence. EXAM: PORTABLE CHEST 1 VIEW COMPARISON:  Earlier the same day FINDINGS: 1924 hours. Endotracheal tube tip is 4.2 cm above the base of the carina. Slight increase in bibasilar atelectasis with pneumonia not excluded at the left base on this exam. The cardiopericardial silhouette is within normal limits for size. The NG tube passes into the stomach although the distal tip position is not included on the film. Telemetry leads overlie the chest. IMPRESSION: Endotracheal tube tip 4.2 cm above the base of the carina. Slight increase in left base atelectasis with pneumonia not completely excluded by imaging. Electronically Signed   By: Kennith Center M.D.   On: 10-Mar-2016 19:33  Dg Chest Portable 1 View  Result Date: 2016/03/10 CLINICAL DATA:   Altered mental status. EXAM: PORTABLE CHEST 1 VIEW COMPARISON:  08/18/2014 FINDINGS: 1643 hours. Basilar atelectasis, right greater than left. No focal airspace consolidation or overt pulmonary edema. No pleural effusion. The cardiopericardial silhouette is within normal limits for size. Patient is status post T12-L2 fusion. Telemetry leads overlie the chest. IMPRESSION: Basilar atelectasis without acute findings. Electronically Signed   By: Kennith Center M.D.   On: 10-Mar-2016 16:52    STUDIES:  CXR 03/11/2023 > atx CT head March 11, 2023 > no acute process.  CULTURES: Sputum March 11, 2023 >  ANTIBIOTICS: None.  SIGNIFICANT EVENTS: 03/11/23 admit after suspected OD (unknown substance)  LINES/TUBES: ETT Mar 11, 2023 >  DISCUSSION: 35F admitted for suspected overdose 2023-03-11. UDS negative and ETOH positive in ED. Intubated for airway protection.   ASSESSMENT / PLAN:  NEUROLOGIC A:   Acute encephalopathy. Suspected overdose -  UDS negative but has hx of overdose along with several suicide attempts. P:   Sedation: Propofol gtt / Fentanyl gtt. RASS goal: 0 to -1. Daily WUA. Psych consult once extubated.  PULMONARY A: Failure to protect airway secondary to toxic encephalopathy. P:   Full vent support. ABG. Wean as able. CXR.  CARDIOVASCULAR A:  No acute issues. P: Monitor hemodynamics.  RENAL A:   No acute issues. Pseudohypocalcemia - corrects to 9.4. P:   NS @ 100. Assess ionized calcium. BMP in AM.  GASTROINTESTINAL A:   GI prophylaxis. Nutrition. P:   SUP: pantoprazole. NPO.  HEMATOLOGIC A:   VTE prophylaxis. P:  SCD's / Heparin. CBC in AM.  INFECTIOUS A:   No indication of infection. P:   Follow cultures. Assess PCT - if high then start empiric abx.  ENDOCRINE A:   No acute issues. P:   Monitor glucose on BMP.   FAMILY  Updates: Father updated at bedside.  Inter-disciplinary family meet or Palliative Care meeting due by:  02/19/16.    Rutherford Guys, [place] [facility] [place]  Pulmonary & Critical Care Medicine Pager: [phone]  or [phone] 02/13/2016, 8:52 PM   Attending note: I have seen and examined the patient with nurse practitioner/resident and agree with the note. History, labs and imaging reviewed.  30 Y/O with multiple psychiatric issues including depression, eating disorder, bipolar disorder, recent suicide attempt admitted with altered mental status, unknown drug OD. Intubated in ED  Blood pressure (!) 134/102, pulse 73, temperature (!) 96.3 F (35.7 C), temperature source Rectal, resp. rate 20, height 5\' 7"  (1.702 m), weight 110 lb (49.9 kg), SpO2 100 %. Sedated, no distress CVS- RRR RS-Clear Abd- Soft, + BS Ext- no edema  Labs and imaging reviewed CXR >bibasal atelectasis  - Increase PEEP for low P02.  - Continue 8cc/kg ventilation. May need to go lower if she develops ARDS - There is concern for aspiration pneumonitis but observe off abx for now. - Check procalctionin - Wake up assessment in AM - Check EKG.  Rest of plan as above. Critical care time- 35 mins.   Chilton Greathouse MD [place] [facility] Pager [phone] If no answer or after 3pm call: [phone] 02/13/2016, 9:07 PM

## 2016-02-13 NOTE — Progress Notes (Signed)
RT NOTE:  Pt transported to [facility] without event. Report given to Minerva Areola, RT over phone and Jomarie Longs, RT upon arrival to unit.

## 2016-02-13 NOTE — ED Triage Notes (Signed)
Pt in from [employer] [address] [facility]. Where pt reported by EMS was being seen for Eating disorder, pt noted to have slurred speech at office, & she became unresponsive, pt GCS 3

## 2016-02-14 ENCOUNTER — Inpatient Hospital Stay (HOSPITAL_COMMUNITY): Payer: BLUE CROSS/BLUE SHIELD

## 2016-02-14 DIAGNOSIS — T1491 Suicide attempt: Secondary | ICD-10-CM

## 2016-02-14 DIAGNOSIS — G9340 Encephalopathy, unspecified: Secondary | ICD-10-CM

## 2016-02-14 DIAGNOSIS — F4312 Post-traumatic stress disorder, chronic: Secondary | ICD-10-CM

## 2016-02-14 DIAGNOSIS — J9601 Acute respiratory failure with hypoxia: Secondary | ICD-10-CM

## 2016-02-14 DIAGNOSIS — F331 Major depressive disorder, recurrent, moderate: Secondary | ICD-10-CM

## 2016-02-14 DIAGNOSIS — F1010 Alcohol abuse, uncomplicated: Secondary | ICD-10-CM

## 2016-02-14 DIAGNOSIS — F5000 Anorexia nervosa, unspecified: Secondary | ICD-10-CM

## 2016-02-14 LAB — BASIC METABOLIC PANEL
ANION GAP: 6 (ref 5–15)
CALCIUM: 8.1 mg/dL — AB (ref 8.9–10.3)
CO2: 24 mmol/L (ref 22–32)
Chloride: 111 mmol/L (ref 101–111)
Creatinine, Ser: 0.88 mg/dL (ref 0.44–1.00)
GFR calc Af Amer: >60 mL/min (ref >60)
GLUCOSE: 85 mg/dL (ref 65–99)
Potassium: 4.4 mmol/L (ref 3.5–5.1)
Sodium: 141 mmol/L (ref 135–145)

## 2016-02-14 LAB — CBC
HEMATOCRIT: 35.9 % — AB (ref 36.0–46.0)
Hemoglobin: 11.7 g/dL — ABNORMAL LOW (ref 12.0–15.0)
MCH: 28.5 pg (ref 26.0–34.0)
MCHC: 32.6 g/dL (ref 30.0–36.0)
MCV: 87.6 fL (ref 78.0–100.0)
PLATELETS: 290 10*3/uL (ref 150–400)
RBC: 4.1 MIL/uL (ref 3.87–5.11)
RDW: 16.1 % — AB (ref 11.5–15.5)
WBC: 8.3 10*3/uL (ref 4.0–10.5)

## 2016-02-14 LAB — MAGNESIUM: Magnesium: 1.6 mg/dL — ABNORMAL LOW (ref 1.7–2.4)

## 2016-02-14 LAB — PROCALCITONIN: Procalcitonin: <0.1 ng/mL

## 2016-02-14 LAB — PHOSPHORUS: PHOSPHORUS: 1.6 mg/dL — AB (ref 2.5–4.6)

## 2016-02-14 LAB — MRSA PCR SCREENING: MRSA by PCR: NEGATIVE

## 2016-02-14 MED ORDER — FENTANYL BOLUS VIA INFUSION
25.0000 ug | INTRAVENOUS | Status: DC | PRN
  Filled 2016-02-14: qty 100

## 2016-02-14 MED ORDER — SODIUM CHLORIDE 0.9 % IV SOLN: 1.0000 mg | Freq: Every day | INTRAVENOUS | Status: DC

## 2016-02-14 MED ORDER — POTASSIUM PHOSPHATES 15 MMOLE/5ML IV SOLN
30.0000 mmol | Freq: Once | INTRAVENOUS | Status: AC
  Administered 2016-02-14: 30 mmol via INTRAVENOUS
  Filled 2016-02-14: qty 10

## 2016-02-14 MED ORDER — ENSURE ENLIVE PO LIQD
237.0000 mL | Freq: Two times a day (BID) | ORAL | Status: DC
  Administered 2016-02-15 – 2016-02-25 (×9): 237 mL via ORAL

## 2016-02-14 MED ORDER — ONDANSETRON HCL 4 MG/2ML IJ SOLN
INTRAMUSCULAR | Status: AC
  Filled 2016-02-14: qty 2

## 2016-02-14 MED ORDER — DULOXETINE HCL 30 MG PO CPEP
30.0000 mg | ORAL_CAPSULE | Freq: Every day | ORAL | Status: DC
  Administered 2016-02-14 – 2016-02-25 (×12): 30 mg via ORAL
  Filled 2016-02-14 (×12): qty 1

## 2016-02-14 MED ORDER — FOLIC ACID 5 MG/ML IJ SOLN
1.0000 mg | Freq: Every day | INTRAMUSCULAR | Status: DC
  Administered 2016-02-14: 1 mg via INTRAVENOUS
  Filled 2016-02-14 (×2): qty 0.2

## 2016-02-14 MED ORDER — PROPOFOL 1000 MG/100ML IV EMUL
5.0000 ug/kg/min | INTRAVENOUS | Status: DC
  Administered 2016-02-14 (×2): 40 ug/kg/min via INTRAVENOUS
  Filled 2016-02-14: qty 100

## 2016-02-14 MED ORDER — ONDANSETRON HCL 4 MG/2ML IJ SOLN
4.0000 mg | Freq: Once | INTRAMUSCULAR | Status: AC
  Administered 2016-02-14: 4 mg via INTRAVENOUS

## 2016-02-14 MED ORDER — THIAMINE HCL 100 MG/ML IJ SOLN
100.0000 mg | Freq: Every day | INTRAMUSCULAR | Status: DC
  Administered 2016-02-14: 100 mg via INTRAVENOUS
  Filled 2016-02-14: qty 2

## 2016-02-14 MED ORDER — ONDANSETRON HCL 4 MG/2ML IJ SOLN: 4.0000 mg | Freq: Four times a day (QID) | INTRAMUSCULAR | Status: DC | PRN

## 2016-02-14 MED ORDER — TRAZODONE HCL 50 MG PO TABS
50.0000 mg | ORAL_TABLET | Freq: Every day | ORAL | Status: DC
  Administered 2016-02-14 – 2016-02-20 (×7): 50 mg via ORAL
  Filled 2016-02-14 (×7): qty 1

## 2016-02-14 MED ORDER — SODIUM CHLORIDE 0.9 % IV BOLUS (SEPSIS)
500.0000 mL | Freq: Once | INTRAVENOUS | Status: AC
  Administered 2016-02-15: 500 mL via INTRAVENOUS

## 2016-02-14 MED ORDER — MAGNESIUM SULFATE 50 % IJ SOLN
3.0000 g | Freq: Once | INTRAVENOUS | Status: AC
  Administered 2016-02-14: 3 g via INTRAVENOUS
  Filled 2016-02-14: qty 6

## 2016-02-14 MED ORDER — PRAZOSIN HCL 2 MG PO CAPS
2.0000 mg | ORAL_CAPSULE | Freq: Every day | ORAL | Status: DC
  Administered 2016-02-14 – 2016-02-24 (×11): 2 mg via ORAL
  Filled 2016-02-14 (×11): qty 1

## 2016-02-14 MED ORDER — ANTISEPTIC ORAL RINSE SOLUTION (CORINZ)
7.0000 mL | OROMUCOSAL | Status: DC
  Administered 2016-02-14 (×2): 7 mL via OROMUCOSAL

## 2016-02-14 MED ORDER — CHLORHEXIDINE GLUCONATE 0.12% ORAL RINSE (MEDLINE KIT)
15.0000 mL | Freq: Two times a day (BID) | OROMUCOSAL | Status: DC
  Administered 2016-02-14: 15 mL via OROMUCOSAL

## 2016-02-14 NOTE — Progress Notes (Signed)
RT called to pts bedside to extubate pt. RT found pt on vent settings cpap/psv. Peep 5, PSV 0, 30% FIO2.

## 2016-02-14 NOTE — Progress Notes (Signed)
PCCM Attending SBT/WUA Note: Patient now more awake. Following commands and moving all 4 extremities equally. Complaining of some nausea. Tidal volume greater than 1000 mL on spontaneous breathing trial with pressure support 0/5. Checking for cuff leak and probable extubation soon.  Donna Christen Jamison Neighbor, M.D. [facility] Pulmonary & Critical Care Pager:  [phone] After 3pm or if no response, call [phone] 9:55 AM 02/14/16

## 2016-02-14 NOTE — Progress Notes (Signed)
PCCM Rounding Note:  Patient seen again at bedside post extubation. Respiratory status stable on nasal cannula. Patient sleeping until awoken. Grossly nonfocal.  Knows the year and president but not oriented as to her currently location or the events transpiring prior to her arrival at the E.D. Denies any overdose or suicidal ideation. Given history and presentation I have personally consulted psychiatry to a formal evaluation. Sitter at bedside. Continuing suicide precautions. Patient reports nausea has resolved. Starting clear liquid diet & advancing as tolerated after nurse assess swallowing.  Donna Christen Jamison Neighbor, M.D. [facility] Pulmonary & Critical Care Pager:  [phone] After 3pm or if no response, call [phone] 1:08 PM 02/14/16

## 2016-02-14 NOTE — Progress Notes (Signed)
SBT trials done X 2 using Cpap/PSV 5/5, 30. Pt went apneic X 2. RN aware of the SBT trials. RT will cont to follow
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
## 2016-02-14 NOTE — Progress Notes (Signed)
Wasted of fentanyl with Jarome Lamas, RN

## 2016-02-14 NOTE — Care Management Note (Signed)
Case Management Note  Patient Details  Name: Michelle Juarez MRN: [id number] Date of Birth: [birth year]  Subjective/Objective:    Pt admitted on 02/13/16 with respiratory failure secondary to overdose on unknown substance.  PTA, pt independent of ADLS, has supportive family.                  Action/Plan: Psych consult pending.  Will follow for discharge planning as pt progresses.    Expected Discharge Date:                  [facility] Plan[facility]  [facility]  In-House Referral:  Clinical Social Work  Discharge planning Services  CM Consult  Post Acute Care Choice:    Choice offered to:     DME Arranged:    DME Agency:     HH Arranged:    HH Agency:     Status of Service:  In process, will continue to follow  If discussed at Long Length of Stay Meetings, dates discussed:    Additional Comments:  Quintella Baton, RN, BSN  Trauma/Neuro ICU Case Manager [phone]

## 2016-02-14 NOTE — Progress Notes (Signed)
PULMONARY / CRITICAL CARE MEDICINE   Name: Michelle Juarez MRN: [id number] DOB: 1985/12/12    ADMISSION DATE:  02/13/2016 CONSULTATION DATE:  02/13/2016  REFERRING MD:  Dr Juleen China EDP  CHIEF COMPLAINT:  overdose  SUBJECTIVE:  No acute events overnight. She reports with sedation vacation this morning patient was moving all 4 extremities and responding appropriately.  REVIEW OF SYSTEMS:  Unable to obtain as patient is sedated & intubated.  VITAL SIGNS: BP (!) 134/110   Pulse 78   Temp 98.1 F (36.7 C) (Axillary)   Resp 20   Ht  (1.702 m)   Wt 128 lb 12 oz (58.4 kg)   LMP  (LMP Unknown)   SpO2 100%   BMI 20.16 kg/m   HEMODYNAMICS:    VENTILATOR SETTINGS: Vent Mode: PRVC FiO2 (%):  [40 %-100 %] 40 % Set Rate:  [18 bmp-20 bmp] 20 bmp Vt Set:  [490 mL] 490 mL PEEP:  [5 cmH20-8 cmH20] 5 cmH20 Plateau Pressure:  [12 cmH20-18 cmH20] 14 cmH20  INTAKE / OUTPUT: I/O last 3 completed shifts: In: 2848 [I.V.:1948; Other:900] Out: 2030 [Urine:1830; Other:200]  PHYSICAL EXAMINATION: General:  Young female. No distress. Sedated on ventilator. Neuro:  Spontaneously moving all 4 extremities to tactile stimulation. Pupils symmetric and equal. Sedated and not following commands. HEENT: No scleral icterus or injection. Moist mucous membranes. Endotracheal tube in place. Cardiovascular:  Regular rate and rhythm. No edema or JVD. Pulmonary:  Clear bilaterally to auscultation. Symmetric chest wall rise on ventilator. Abdomen: Soft. Normal bowel sounds. Nondistended. Integument: Warm and dry. No rash on exposed skin.  LABS:  BMET  Recent Labs Lab 02/13/16 1650 02/14/16 0415  NA 137 141  K 3.5 4.4  CL 109 111  CO2 24 24  BUN 6 <5*  CREATININE 0.98 0.88  GLUCOSE 71 85    Electrolytes  Recent Labs Lab 02/13/16 1650 02/14/16 0415  CALCIUM 8.6* 8.1*  MG  --  1.6*  PHOS  --  1.6*    CBC  Recent Labs Lab 02/13/16 1650 02/14/16 0415  WBC 8.6 8.3  HGB 11.2* 11.7*   HCT 35.0* 35.9*  PLT 224 290    Coag's No results for input(s): APTT, INR in the last 168 hours.  Sepsis Markers  Recent Labs Lab 02/13/16 1650 02/13/16 2028  LATICACIDVEN 1.95* 1.83    ABG  Recent Labs Lab 02/13/16 1649 02/13/16 1948 02/14/16 0532  PHART 7.299* 7.320* 7.489*  PCO2ART 48.2* 43.5 25.0*  PO2ART 141.0* 51.0* 375*    Liver Enzymes  Recent Labs Lab 02/13/16 1650  AST 21  ALT 14  ALKPHOS 44  BILITOT 0.2*  ALBUMIN 3.0*    Cardiac Enzymes No results for input(s): TROPONINI, PROBNP in the last 168 hours.  Glucose  Recent Labs Lab 02/13/16 1625  GLUCAP 81    Imaging Ct Head Wo Contrast  Result Date: 02/13/2016 CLINICAL DATA:  Acute onset of slurred speech and unresponsiveness EXAM: CT HEAD WITHOUT CONTRAST TECHNIQUE: Contiguous axial images were obtained from the base of the skull through the vertex without intravenous contrast. COMPARISON:  08/18/2014 FINDINGS: The bony calvarium is intact. No findings to suggest acute hemorrhage, acute infarction or space-occupying mass lesion are noted. IMPRESSION: No acute intracranial abnormality noted. Electronically Signed   By: Alcide Clever M.D.   On: 02/13/2016 17:44  Dg Chest Port 1 View  Result Date: 02/14/2016 CLINICAL DATA:  Respiratory failure. EXAM: PORTABLE CHEST 1 VIEW COMPARISON:  Radiograph of February 13, 2016. FINDINGS: The  heart size and mediastinal contours are within normal limits. Both lungs are clear. No pneumothorax or pleural effusion is noted. Endotracheal and nasogastric tubes are in grossly good position. The visualized skeletal structures are unremarkable. IMPRESSION: Stable support apparatus. No acute cardiopulmonary abnormality seen. Electronically Signed   By: Lupita Raider, M.D.   On: 02/14/2016 07:08  Dg Chest Port 1 View  Result Date: 02/13/2016 CLINICAL DATA:  Ventilator dependence. EXAM: PORTABLE CHEST 1 VIEW COMPARISON:  Earlier the same day FINDINGS: 1924 hours.  Endotracheal tube tip is 4.2 cm above the base of the carina. Slight increase in bibasilar atelectasis with pneumonia not excluded at the left base on this exam. The cardiopericardial silhouette is within normal limits for size. The NG tube passes into the stomach although the distal tip position is not included on the film. Telemetry leads overlie the chest. IMPRESSION: Endotracheal tube tip 4.2 cm above the base of the carina. Slight increase in left base atelectasis with pneumonia not completely excluded by imaging. Electronically Signed   By: Kennith Center M.D.   On: 02/13/2016 19:33  Dg Chest Portable 1 View  Result Date: 02/13/2016 CLINICAL DATA:  Altered mental status. EXAM: PORTABLE CHEST 1 VIEW COMPARISON:  08/18/2014 FINDINGS: 1643 hours. Basilar atelectasis, right greater than left. No focal airspace consolidation or overt pulmonary edema. No pleural effusion. The cardiopericardial silhouette is within normal limits for size. Patient is status post T12-L2 fusion. Telemetry leads overlie the chest. IMPRESSION: Basilar atelectasis without acute findings. Electronically Signed   By: Kennith Center M.D.   On: 02/13/2016 16:52    STUDIES:  Port CXR 7/27:  Bibasilar atelectasis. No focal consolidation. EKG 7/27:  NSR. QTc . CT Head w/o 7/27:  No acute intracranial process. Port CXR 7/28: Endotracheal tube in good position. Enteric feeding tube coursing below diaphragm. Rotated left with elevation of left hemidiaphragm. No focal opacity or effusion appreciated. Heart normal in size.  MICROBIOLOGY: MRSA PCR 7/27:  Negative  ANTIBIOTICS: None.  SIGNIFICANT EVENTS: 7/27 - Admit & intubated in ED w/ suspected overdose  LINES/TUBES: OETT 7.5 7/27>>> OGT 7/27>>> FOLEY 7/27>>> PIV x2  ASSESSMENT / PLAN:  NEUROLOGIC A:   Acute Encephalopathy - Likely secondary to overdose. Suspected Overdose - UDS negative but has hx of overdose along with several suicide attempts. Probable Suicide  Attempt Alcohol Use - Serum level 77 on admit.  P:   RASS goal: 0 to -1 Propofol gtt Fentanyl gtt & IV prn Suicide Precautions Thiamine & FA IV daily WUA today Psychiatry Consult once extuabted  PULMONARY A: Acute Hypoxic Respiratory Failure - Unable to protect airway due to encephalopathy.  P:   Full vent support. SBT this AM  CARDIOVASCULAR A:  No acute issues.  P: Vitals per unit protocol Monitor on telemetry  RENAL A:   Hypomagnesemia - Replacing. Hypophosphatemia - Replacing. Pseudohypocalcemia  P:   Monitoring UOP with Foley Trending electrolytes & renal function daily Replacing electrolytes as indicated KPhos IV Magnesium Sulfate 3gm IV  GASTROINTESTINAL A:   No acute issues.  P:   NPO Holding on Tube Feedings Protonix VT daily  HEMATOLOGIC A:   Anemia - Mild. No signs of active bleeding.  P:  Trending cell counts w/ CBC SCDs Heparin [place] q8hr  INFECTIOUS A:   No indication of infection.  P:   Holding on antibiotics Trending Procalcitonin per algorithm  ENDOCRINE A:   No acute issues.  P:   Monitor glucose on BMP  FAMILY  Updates: Father updated at bedside 7/28 by Dr. Jamison Neighbor.  Inter-disciplinary family meet or Palliative Care meeting due by:  02/19/16.   TODAY'S SUMMARY:  54F admitted for suspected overdose 7/27. UDS negative and ETOH positive in ED. Intubated for airway protection. Patient reportedly following commands this morning. Attempting another wakeup assessment to determine if extubation as possible. Ordering suicide precautions. Patient will need psychiatry consultation and sitter at bedside once extubated given potential of suicide attempt.  I have spent a total of 34 minutes of critical care time today caring for the patient, updating father bedside, and reviewing the patient's electronic medical record as well as discussing plan of care with nurse at bedside.  Donna Christen Jamison Neighbor, M.D. [facility] Pulmonary &  Critical Care Pager:  [phone] After 3pm or if no response, call [phone] 02/14/2016, 9:10 AM

## 2016-02-14 NOTE — Progress Notes (Signed)
SBT trial done. SBT trial terminated due to pt going apneic. Placed pt back on full support. Decreased FIO2 30% and Peep 5 due to stable sats. ETT secure. No breakdown noted on lips. Moved ETT to pts right.

## 2016-02-14 NOTE — Procedures (Signed)
Extubation Procedure Note  Patient Details:   Name: Michelle Juarez DOB: December 19, 1985 MRN: [id number]   Airway Documentation:     Evaluation  O2 sats: 100  Complications: No apparent complications Patient did tolerate procedure well. Bilateral Breath Sounds: Clear   Pt is awake and can follow commands for RT. Per MD order ok to extubate. Deep oral sxn done. Pt has a positive cuff leak. Pt has minimal secretions and good cough/gag. Extubated pt to 2L [place]. Pt tol well. No stridor. Pt can speak her name and year. RN at bedside. Mouth care done by RN. BBS clear. Pt on 2L sats 100, RR 16. Pt in no distress.  Kandis Nab 02/14/2016, 10:25 AM

## 2016-02-14 NOTE — Progress Notes (Signed)
Initial Nutrition Assessment  DOCUMENTATION CODES:   Not applicable  INTERVENTION:   Offer Ensure Enlive po BID once diet advanced. Each supplement provides 350 kcal and 20 grams of protein   NUTRITION DIAGNOSIS:   Inadequate oral intake related to chronic illness (eating disorder) as evidenced by per patient/family report.  GOAL:   Patient will meet greater than or equal to 90% of their needs  MONITOR:   PO intake, Supplement acceptance, I & O's, Weight trends, Labs  REASON FOR ASSESSMENT:   Malnutrition Screening Tool    ASSESSMENT:   78F admitted for suspected overdose 7/27. UDS negative and ETOH positive in ED.   Per pt she has an active eating disorder. She has received treatment in [place] and has been there 3 times. She states that she is not allowed to go back but I am unclear to the reason why. Pt has just been extubated and has a hoarse voice.  She states that she does not remember anything before she got here and did not try to hurt herself. Pt became tearful stating that she did not want to be locked up in the psych unit. She states that she eats well in the hospital so she can go home.   7/28 extubated Medications reviewed and include: magnesium sulfate, Kphos, thiamine, folic acid Labs reviewed: PO4 1.6, Mg 1.6 Nutrition-Focused physical exam completed. Findings are no fat depletion, no muscle depletion, and no edema.     Diet Order:  Diet clear liquid Room service appropriate? Yes; Fluid consistency: Thin  Skin:  Reviewed, no issues  Last BM:  unknown  Height:   Ht Readings from Last 1 Encounters:  02/13/16 5\' 7"  (1.702 m)    Weight:   Wt Readings from Last 1 Encounters:  02/14/16 128 lb 12 oz (58.4 kg)    Ideal Body Weight:  61.3 kg  BMI:  Body mass index is 20.16 kg/m.  Estimated Nutritional Needs:   Kcal:  1700-1900  Protein:  75-85 grams  Fluid:  > 1.7 L/day  EDUCATION NEEDS:   No education needs identified at this  time  Kendell Bane RD, LDN, CNSC [phone] Pager [phone] After Hours Pager

## 2016-02-15 LAB — CBC WITH DIFFERENTIAL/PLATELET
BASOS PCT: 0 %
Basophils Absolute: 0 10*3/uL (ref 0.0–0.1)
EOS ABS: 0.1 10*3/uL (ref 0.0–0.7)
EOS PCT: 1 %
HCT: 37.6 % (ref 36.0–46.0)
HEMOGLOBIN: 11.8 g/dL — AB (ref 12.0–15.0)
LYMPHS ABS: 1.8 10*3/uL (ref 0.7–4.0)
LYMPHS PCT: 15 %
MCH: 28.3 pg (ref 26.0–34.0)
MCHC: 31.4 g/dL (ref 30.0–36.0)
MCV: 90.2 fL (ref 78.0–100.0)
MONO ABS: 1 10*3/uL (ref 0.1–1.0)
MONOS PCT: 9 %
Neutro Abs: 8.7 10*3/uL — ABNORMAL HIGH (ref 1.7–7.7)
Neutrophils Relative %: 75 %
PLATELETS: 265 10*3/uL (ref 150–400)
RBC: 4.17 MIL/uL (ref 3.87–5.11)
RDW: 15.7 % — ABNORMAL HIGH (ref 11.5–15.5)
WBC: 11.6 10*3/uL — ABNORMAL HIGH (ref 4.0–10.5)

## 2016-02-15 LAB — RENAL FUNCTION PANEL
Albumin: 2.5 g/dL — ABNORMAL LOW (ref 3.5–5.0)
Anion gap: 5 (ref 5–15)
CHLORIDE: 107 mmol/L (ref 101–111)
CO2: 24 mmol/L (ref 22–32)
CREATININE: 0.78 mg/dL (ref 0.44–1.00)
Calcium: 7.9 mg/dL — ABNORMAL LOW (ref 8.9–10.3)
GFR calc Af Amer: >60 mL/min (ref >60)
Glucose, Bld: 89 mg/dL (ref 65–99)
Phosphorus: 3 mg/dL (ref 2.5–4.6)
Potassium: 4 mmol/L (ref 3.5–5.1)
Sodium: 136 mmol/L (ref 135–145)

## 2016-02-15 LAB — HEPATIC FUNCTION PANEL
ALBUMIN: 2.4 g/dL — AB (ref 3.5–5.0)
ALK PHOS: 48 U/L (ref 38–126)
ALT: 15 U/L (ref 14–54)
AST: 22 U/L (ref 15–41)
BILIRUBIN INDIRECT: 0.7 mg/dL (ref 0.3–0.9)
Bilirubin, Direct: 0.1 mg/dL (ref 0.1–0.5)
TOTAL PROTEIN: 4.7 g/dL — AB (ref 6.5–8.1)
Total Bilirubin: 0.8 mg/dL (ref 0.3–1.2)

## 2016-02-15 LAB — PROCALCITONIN

## 2016-02-15 LAB — CALCIUM, IONIZED: Calcium, Ionized, Serum: 4.7 mg/dL (ref 4.5–5.6)

## 2016-02-15 LAB — MAGNESIUM: MAGNESIUM: 1.8 mg/dL (ref 1.7–2.4)

## 2016-02-15 MED ORDER — FOLIC ACID 1 MG PO TABS
1.0000 mg | ORAL_TABLET | Freq: Every day | ORAL | Status: DC
  Administered 2016-02-15 – 2016-02-25 (×11): 1 mg via ORAL
  Filled 2016-02-15 (×11): qty 1

## 2016-02-15 MED ORDER — PHENOL 1.4 % MT LIQD: 1.0000 | OROMUCOSAL | Status: DC | PRN

## 2016-02-15 MED ORDER — VITAMIN B-1 100 MG PO TABS
100.0000 mg | ORAL_TABLET | Freq: Every day | ORAL | Status: DC
  Administered 2016-02-15 – 2016-02-25 (×11): 100 mg via ORAL
  Filled 2016-02-15 (×11): qty 1

## 2016-02-15 NOTE — Progress Notes (Signed)
Patient stated outpatient therapist refused to talk to her until medical team spoke with her. Spoke with patient about if she wanted her therapist to know information about her plan of care, and patient stated she does not mind if therapist knows all details of her care. Therapist called with patient's permission and fully aware of what was discussed in conversation from both sides. Therapist has intense concerns about patient being able to make decisions for herself. States she has "very poor judgement" and that she feels "if she leaves, she will die." Attending called, and requesting psych to see patient. [facility] called and given patient's medical record number with the intention of having someone follow up again. Patient updated on plan of care, and satisfied with plan at this time.

## 2016-02-15 NOTE — Progress Notes (Signed)
PULMONARY / CRITICAL CARE MEDICINE   Name: Michelle Juarez MRN: [id number] DOB: [birth year]    ADMISSION DATE:  02/13/2016 CONSULTATION DATE:  02/13/2016  REFERRING MD:  Dr Juleen China EDP  CHIEF COMPLAINT:  overdose  SUBJECTIVE:  Patient extubated yesterday. No acute events overnight per nursing. Patient's diet successfully being advanced. Patient recalls drinking alcohol prior to her visit with her therapist but continues to deny any overdose or suicidal ideation. She does not recall the events leading up to her visit with her therapist and subsequently transferred to our facility. Denies any headache or vision changes. Denies any dyspnea or cough.  REVIEW OF SYSTEMS:  No nausea, vomiting, or abdominal pain. No fever, chills, or sweats.  VITAL SIGNS: BP (!) 83/60   Pulse 76   Temp 98.1 F (36.7 C) (Oral)   Resp 20   Ht 5\' 7"  (1.702 m)   Wt 136 lb 3.9 oz (61.8 kg)   LMP  (LMP Unknown)   SpO2 93%   BMI 21.34 kg/m   HEMODYNAMICS:    VENTILATOR SETTINGS:    INTAKE / OUTPUT: I/O last 3 completed shifts: In: 3723 [P.O.:50; I.V.:3057; IV Piggyback:616] Out: 0947 [SJGGE:3662; Other:200]  PHYSICAL EXAMINATION: General:  Female. No distress. Father bedside. Neuro:  Following commands. Cranial nerves grossly intact. Nonfocal. HEENT: Tacky mucous membranes. No scleral injection or icterus. Pupils symmetric. Cardiovascular:  Regular rate and rhythm. No edema. Pulmonary: Clear bilaterally to auscultation. Normal work of breathing on room air. Speaking in complete sentences. Abdomen: Soft. Normal bowel sounds. Nondistended. Nontender. Integument: Warm and dry. No rash on exposed skin.  LABS:  BMET  Recent Labs Lab 02/13/16 1650 02/14/16 0415 02/15/16 0300  NA 137 141 136  K 3.5 4.4 4.0  CL 109 111 107  CO2 24 24 24   BUN 6 <5* <5*  CREATININE 0.98 0.88 0.78  GLUCOSE 71 85 89    Electrolytes  Recent Labs Lab 02/13/16 1650 02/14/16 0415 02/15/16 0243 02/15/16 0300   CALCIUM 8.6* 8.1*  --  7.9*  MG  --  1.6* 1.8  --   PHOS  --  1.6*  --  3.0    CBC  Recent Labs Lab 02/13/16 1650 02/14/16 0415 02/15/16 0243  WBC 8.6 8.3 11.6*  HGB 11.2* 11.7* 11.8*  HCT 35.0* 35.9* 37.6  PLT 224 290 265    Coag's No results for input(s): APTT, INR in the last 168 hours.  Sepsis Markers  Recent Labs Lab 02/13/16 1650 02/13/16 2028 02/14/16 0415 02/15/16 0243  LATICACIDVEN 1.95* 1.83  --   --   PROCALCITON  --   --  <0.10 <0.10    ABG  Recent Labs Lab 02/13/16 1649 02/13/16 1948 02/14/16 0532  PHART 7.299* 7.320* 7.489*  PCO2ART 48.2* 43.5 25.0*  PO2ART 141.0* 51.0* 375*    Liver Enzymes  Recent Labs Lab 02/13/16 1650 02/15/16 0243 02/15/16 0300  AST 21 22  --   ALT 14 15  --   ALKPHOS 44 48  --   BILITOT 0.2* 0.8  --   ALBUMIN 3.0* 2.4* 2.5*    Cardiac Enzymes No results for input(s): TROPONINI, PROBNP in the last 168 hours.  Glucose  Recent Labs Lab 02/13/16 1625  GLUCAP 81    Imaging No results found.   STUDIES:  Port CXR 7/27:  Bibasilar atelectasis. No focal consolidation. EKG 7/27:  NSR. QTc . CT Head w/o 7/27:  No acute intracranial process. Port CXR 7/28: Endotracheal tube in good position. Enteric feeding  tube coursing below diaphragm. Rotated left with elevation of left hemidiaphragm. No focal opacity or effusion appreciated. Heart normal in size.  MICROBIOLOGY: MRSA PCR 7/27:  Negative  ANTIBIOTICS: None.  SIGNIFICANT EVENTS: 7/27 - Admit & intubated in ED w/ suspected overdose 7/28 - Extubated  LINES/TUBES: OETT 7.5 7/27 - 7/28 OGT 7/27 - 7/28 FOLEY 7/27 - 7/28 PIV x2  ASSESSMENT / PLAN:  NEUROLOGIC A:   Acute Encephalopathy - Likely secondary to overdose. Resolving. Suspected Overdose - UDS negative but has hx of overdose along with several suicide attempts. Probable Suicide Attempt - Cleared by Psychiatry 7/28. Alcohol Use - Serum level 77 on admit.  P:   Restarted  Prazosin, Trazodone, & Cymbalta per Psych recommendations Thiamine & FA PO daily Monitor closely w/ Neuro Checks q4hr  PULMONARY A: Acute Hypoxic Respiratory Failure - Unable to protect airway due to encephalopathy. Resolved.  P:   Continuous Pulse Oximetry Continue Pulmonary Toilette with incentive spirometer  CARDIOVASCULAR A:  No acute issues.  P: Vitals per unit protocol Monitor on telemetry  RENAL A:   Hypomagnesemia - Resolved. Hypophosphatemia - Resolved. Pseudohypocalcemia  P:   Monitoring UOP with Foley Trending electrolytes & renal function daily Replacing electrolytes as indicated  GASTROINTESTINAL A:   Eating Disorder  P:   Switching to Regular Diet Ensure D/C Protonix  HEMATOLOGIC A:   Anemia - Mild but stable. No signs of active bleeding. Leukocytosis - Mild. Likely stress response.  P:  Trending cell counts w/ CBC SCDs Heparin [place] q8hr  INFECTIOUS A:   No indication of infection.  P:   Holding on antibiotics Trending Procalcitonin per algorithm  ENDOCRINE A:   No acute issues.  P:   Monitor glucose on BMP  FAMILY  Updates: Father updated at bedside 7/29 by Dr. Jamison Neighbor.   Inter-disciplinary family meet or Palliative Care meeting due by:  02/19/16.   TODAY'S SUMMARY:  60F admitted for suspected overdose 7/27. UDS negative and ETOH positive in ED. Intubated for airway protection and extubated on 7/28. Patient evaluated by psychiatry who felt the patient had competence to make medical decisions. Patient's level of alertness and orientation continually improving. We started on home medication regimen yesterday by psychiatry. Without an identifiable cause for the patient's acute encephalopathy requiring intubation I do not believe it is safe to discharge her to home today. Her hypoxic respiratory failure has completely resolved. As we restart her home medication regimen I will continue to closely monitor the patient with neuro checks every 4  hours as well as telemetry monitoring. Given patient's continued clinical stability and improvement I am transitioning her to a telemetry floor bed. TRH to assume care & PCCM will sign off as of 7/30.  Donna Christen Jamison Neighbor, M.D. [facility] Pulmonary & Critical Care Pager:  [phone] After 3pm or if no response, call [phone] 02/15/2016, 8:33 AM

## 2016-02-15 NOTE — Progress Notes (Signed)
NURSING PROGRESS NOTE  Michelle Juarez [id number] Transfer Data: 02/15/2016 11:36 AM Attending Provider: Chilton Greathouse, MD [id number] ENDOCRINOLOGY Code Status: full  Michelle Juarez is a 30 y.o. female patient transferred from 29M -No acute distress noted.  -No complaints of shortness of breath.  -No complaints of chest pain.   Cardiac Monitoring: Box # 15 in place. Cardiac monitor yields: normal  Blood pressure 106/66, pulse 81, temperature 98.6 F (37 C), temperature source Oral, resp. rate 18, height 5\' 7"  (1.702 m), weight 61.8 kg (136 lb 3.9 oz), SpO2 98 %.   IV Fluids:  IV in place, occlusive dsg intact without redness, IV cath right forearm and left forearm   Allergies:  Lyrica [pregabalin]  Past Medical History:   has a past medical history of Bipolar 1 disorder (HCC); Depression with anxiety; and Eating disorder.  Past Surgical History:   has a past surgical history that includes Ankle surgery and Spine surgery.  Social History:   reports that she has been smoking Cigarettes.  She has been smoking about 0.25 packs per day. She has never used smokeless tobacco. She reports that she drinks alcohol. She reports that she does not use drugs.  Skin: intact  Patient/Family orientated to room. Information packet given to patient/family. Admission inpatient armband information verified with patient/family to include name and date of birth and placed on patient arm. Side rails up x 2, fall assessment and education completed with patient/family. Patient/family able to verbalize understanding of risk associated with falls and verbalized understanding to call for assistance before getting out of bed. Call light within reach. Patient/family able to voice and demonstrate understanding of unit orientation instructions.    Will continue to evaluate and treat per MD orders.

## 2016-02-16 DIAGNOSIS — T426X4A Poisoning by other antiepileptic and sedative-hypnotic drugs, undetermined, initial encounter: Secondary | ICD-10-CM

## 2016-02-16 DIAGNOSIS — T50904A Poisoning by unspecified drugs, medicaments and biological substances, undetermined, initial encounter: Secondary | ICD-10-CM

## 2016-02-16 DIAGNOSIS — F319 Bipolar disorder, unspecified: Secondary | ICD-10-CM

## 2016-02-16 LAB — CBC WITH DIFFERENTIAL/PLATELET
Basophils Absolute: 0 10*3/uL (ref 0.0–0.1)
Basophils Relative: 0 %
EOS ABS: 0.2 10*3/uL (ref 0.0–0.7)
Eosinophils Relative: 2 %
HCT: 38.1 % (ref 36.0–46.0)
HEMOGLOBIN: 12.2 g/dL (ref 12.0–15.0)
LYMPHS ABS: 1.9 10*3/uL (ref 0.7–4.0)
LYMPHS PCT: 21 %
MCH: 28.1 pg (ref 26.0–34.0)
MCHC: 32 g/dL (ref 30.0–36.0)
MCV: 87.8 fL (ref 78.0–100.0)
MONOS PCT: 9 %
Monocytes Absolute: 0.9 10*3/uL (ref 0.1–1.0)
NEUTROS PCT: 68 %
Neutro Abs: 6.2 10*3/uL (ref 1.7–7.7)
Platelets: 266 10*3/uL (ref 150–400)
RBC: 4.34 MIL/uL (ref 3.87–5.11)
RDW: 14.9 % (ref 11.5–15.5)
WBC: 9.2 10*3/uL (ref 4.0–10.5)

## 2016-02-16 LAB — RENAL FUNCTION PANEL
ANION GAP: 5 (ref 5–15)
Albumin: 2.5 g/dL — ABNORMAL LOW (ref 3.5–5.0)
BUN: <5 mg/dL — ABNORMAL LOW (ref 6–20)
CALCIUM: 8.4 mg/dL — AB (ref 8.9–10.3)
CHLORIDE: 108 mmol/L (ref 101–111)
CO2: 23 mmol/L (ref 22–32)
CREATININE: 0.79 mg/dL (ref 0.44–1.00)
GFR calc non Af Amer: >60 mL/min (ref >60)
GLUCOSE: 115 mg/dL — AB (ref 65–99)
Phosphorus: 2.9 mg/dL (ref 2.5–4.6)
Potassium: 3.6 mmol/L (ref 3.5–5.1)
SODIUM: 136 mmol/L (ref 135–145)

## 2016-02-16 LAB — MAGNESIUM: Magnesium: 1.7 mg/dL (ref 1.7–2.4)

## 2016-02-16 LAB — PROCALCITONIN

## 2016-02-16 NOTE — Progress Notes (Signed)
PROGRESS NOTE  Michelle Juarez AYO:[id number] DOB: 1986-07-15 DOA: 02/13/2016 PCP: [facility]  HPI/Recap of past 24 hours:  Anxious, want to leave the hospital, father in room  Assessment/Plan: Active Problems:   Acute respiratory failure (HCC)  Overdose with prescription drug with consumption of alcohol, patient denies suicidal ideation, thought she has history of this.  She was intubated due to encephalopathy by EMS and admitted to icu on 7/27 just three days after being discharged from inpatient psych hospital [facility] in [place]. She is transferred to hospitalist service on 7/30 due to icu attending donot think she can be safely discharged from the hospital even though psych has cleared her on 7/28.  H/o anxiety/depression/bipolar/eating disorder: she has followed by an outpatient therapiest Michelle Juarez [phone]) who has known the patient for the last 7 years. Michelle Juarez has wrote a letter expressing concerns about patient 's safety. She wrote in her letter " it is imperative that Michelle Juarez is not discharged from the hospital until she can be directly transferred to a residential or inpatient eating disorder treatment center." " Michelle Juarez also very motivated to avoid intensive treatment and will  Conceal life threatheing behaviors to do so" Michelle Juarez who have known patient for years strongly believes that patient is a "dangerous to self" Psychiatry reconsulted on 7/30 who recommended inpatient psych placement. Currently patient denies suicidal ideation, not IVC,s but per communication with psych patient should be IVCed should she decided to leave the hospital AMA.   Code Status: full  Family Communication: patient and her father in room  Disposition Plan: pending   Consultants:  psychiatry  Procedures:  Intubation and extubation  Antibiotics:  none   Objective: BP 106/64 (BP Location: Right Arm)   Pulse 85   Temp 98.4 F (36.9 C)  (Oral)   Resp 18   Ht 5\' 7"  (1.702 m)   Wt 54.7 kg (120 lb 9.5 oz)   LMP  (LMP Unknown)   SpO2 100%   BMI 18.89 kg/m   Intake/Output Summary (Last 24 hours) at 02/16/16 1820 Last data filed at 02/16/16 0600  Gross per 24 hour  Intake              600 ml  Output                0 ml  Net              600 ml   Filed Weights   02/15/16 0500 02/15/16 1132 02/16/16 0547  Weight: 61.8 kg (136 lb 3.9 oz) 59.5 kg (131 lb 1.6 oz) 54.7 kg (120 lb 9.5 oz)    Exam:   General:  Anxious   Cardiovascular: RRR  Respiratory: CTABL  Abdomen: Soft/ND/NT, positive BS  Musculoskeletal: No Edema  Neuro: aaox3   Data Reviewed: Basic Metabolic Panel:  Recent Labs Lab 02/13/16 1650 02/14/16 0415 02/15/16 0243 02/15/16 0300 02/16/16 0625  NA 137 141  --  136 136  K 3.5 4.4  --  4.0 3.6  CL 109 111  --  107 108  CO2 24 24  --  24 23  GLUCOSE 71 85  --  89 115*  BUN 6 <5*  --  <5* <5*  CREATININE 0.98 0.88  --  0.78 0.79  CALCIUM 8.6* 8.1*  --  7.9* 8.4*  MG  --  1.6* 1.8  --  1.7  PHOS  --  1.6*  --  3.0 2.9  Liver Function Tests:  Recent Labs Lab 02/13/16 1650 02/15/16 0243 02/15/16 0300 02/16/16 0625  AST 21 22  --   --   ALT 14 15  --   --   ALKPHOS 44 48  --   --   BILITOT 0.2* 0.8  --   --   PROT 5.4* 4.7*  --   --   ALBUMIN 3.0* 2.4* 2.5* 2.5*   No results for input(s): LIPASE, AMYLASE in the last 168 hours. No results for input(s): AMMONIA in the last 168 hours. CBC:  Recent Labs Lab 02/13/16 1650 02/14/16 0415 02/15/16 0243 02/16/16 0625  WBC 8.6 8.3 11.6* 9.2  NEUTROABS 5.4  --  8.7* 6.2  HGB 11.2* 11.7* 11.8* 12.2  HCT 35.0* 35.9* 37.6 38.1  MCV 88.6 87.6 90.2 87.8  PLT 224 290 265 266   Cardiac Enzymes:   No results for input(s): CKTOTAL, CKMB, CKMBINDEX, TROPONINI in the last 168 hours. BNP (last 3 results) No results for input(s): BNP in the last 8760 hours.  ProBNP (last 3 results) No results for input(s): PROBNP in the last 8760  hours.  CBG:  Recent Labs Lab 02/13/16 1625  GLUCAP 81    Recent Results (from the past 240 hour(s))  MRSA PCR Screening     Status: None   Collection Time: 02/13/16 10:33 PM  Result Value Ref Range Status   MRSA by PCR NEGATIVE NEGATIVE Final    Comment:        The GeneXpert MRSA Assay (FDA approved for NASAL specimens only), is one component of a comprehensive MRSA colonization surveillance program. It is not intended to diagnose MRSA infection nor to guide or monitor treatment for MRSA infections.      Studies: No results found.  Scheduled Meds: . DULoxetine  30 mg Oral Daily  . feeding supplement (ENSURE ENLIVE)  237 mL Oral BID BM  . folic acid  1 mg Oral Daily  . heparin  5,000 Units Subcutaneous Q8H  . prazosin  2 mg Oral QHS  . thiamine  100 mg Oral Daily  . traZODone  50 mg Oral QHS    Continuous Infusions:    Time spent: , more than 50% time spent of coordination of care  Zyaira Vejar MD, PhD  [facility] Pager [phone]. If 7PM-7AM, please contact night-coverage at [email or web address], password [facility] 02/16/2016, 6:20 PM  LOS: 3 days

## 2016-02-16 NOTE — Consult Note (Signed)
[facility] Consult   Reason for Consult:  reassessment  Referring Physician:  Dr. Tomie China Patient Identification: Artina Minella MRN:  [id number] Principal Diagnosis: overdose  Diagnosis:   Patient Active Problem List   Diagnosis Date Noted  . Acute respiratory failure ([place]) [J96.00] 02/13/2016    Total Time spent with patient: 30 minutes  Subjective:   Alaja Goldinger is a 30 y.o. female patient admitted with loss of consciousness, respiratory failure .  HPI:  Please review Dr. Barnetta Hammersmith full psychiatric assessment . Patient is a 30 year old single female, lives with her father, is on disability . She presented due to loss of consciousness, respiratory compromise related to overdose- admission BAL 77 .  Patient had a prior similar episode earlier this month, and was briefly hospitalized .  Patient's father at bedside, at patient's request, who provided collateral information . Patient states that these overdoses have been related to taking Lyrica, which she was prescribed for neuropathy. She also acknowledges binge drinking, although irregularly, and states that the combination of Lyrica and alcohol caused the overdose . She denies suicidal intent, and states overdoses were accidental . She states she took the Lyrica as prescribed, did not abuse them . States " it just appears I am very sensitive to it " . She reports some increased  anxiety, depression related to exacerbation of her chronic eating disorder ( reports increased episodes of purging recently-  Of note , current BMI is 18.89, NA+, K+, BUN, Creatinine unremarkable .  Albumin is low at 2.5  ) but denies severe depression, and denies any suicidal ideations . She is future oriented, and states that she is planning on going to an Eating Disorder Residential Setting [facility] ) soon, possibly next week, and that she has been filling out the admission paperwork- father confirms this . Of note, patient's therapist ,  Alvis Lemmings, has sent documentation to Dr. Tomie China, attending physician, expressing concern regarding recent overdoses, eating disorder, and recommending inpatient psychiatric admission .    Past Psychiatric History: long history of Eating Disorder, for which she has been treated in a specialized residential setting in [place]- as noted, states that her eating disorder symptoms have worsened recently, for which she plans to re-enter another specialized residential program. Reports history of prior psychiatric admissions and one suicide attempt several  months ago , by overdosing. Denies history of mania or psychosis . Denies history of alcohol abuse, but binge drinks at times -patient denies daily drinking, answers CAGE questions negatively. Patient 's father states she drinks excessively at times .     Risk to Self: Is patient at risk for suicide?:  (per father at bedside the pt has attempted x 3 suicide attempts with overdoses of prescribed medication, last attempt last week) Risk to Others:  No  Prior Inpatient Therapy:  Yes  Prior Outpatient Therapy:  Yes   Past Medical History:  Past Medical History:  Diagnosis Date  . Bipolar 1 disorder ([place])   . Depression with anxiety   . Eating disorder     Past Surgical History:  Procedure Laterality Date  . ANKLE SURGERY    . SPINE SURGERY     Family History: No family history on file.  Social History:  History  Alcohol Use  . Yes    Comment: unknown amount     History  Drug Use No    Social History   Social History  . Marital status: Single    Spouse name: N/A  .  Number of children: N/A  . Years of education: N/A   Social History Main Topics  . Smoking status: Current Every Day Smoker    Packs/day: 0.25    Types: Cigarettes  . Smokeless tobacco: Never Used  . Alcohol use Yes     Comment: unknown amount  . Drug use: No  . Sexual activity: No   Other Topics Concern  . None   Social History Narrative  . None    Additional Social History:    Allergies:   Allergies  Allergen Reactions  . Lyrica [Pregabalin] Other (See Comments)    Weight gain    Labs:  Results for orders placed or performed during the hospital encounter of 02/13/16 (from the past 48 hour(s))  Procalcitonin     Status: None   Collection Time: 02/15/16  2:43 AM  Result Value Ref Range   Procalcitonin <0.10 ng/mL    Comment:        Interpretation: PCT (Procalcitonin) <= 0.5 ng/mL: Systemic infection (sepsis) is not likely. Local bacterial infection is possible. (NOTE)         ICU PCT Algorithm               Non ICU PCT Algorithm    ----------------------------     ------------------------------         PCT < 0.25 ng/mL                 PCT < 0.1 ng/mL     Stopping of antibiotics            Stopping of antibiotics       strongly encouraged.               strongly encouraged.    ----------------------------     ------------------------------       PCT level decrease by               PCT < 0.25 ng/mL       >= 80% from peak PCT       OR PCT 0.25 - 0.5 ng/mL          Stopping of antibiotics                                             encouraged.     Stopping of antibiotics           encouraged.    ----------------------------     ------------------------------       PCT level decrease by              PCT >= 0.25 ng/mL       < 80% from peak PCT        AND PCT >= 0.5 ng/mL            Continuin g antibiotics                                              encouraged.       Continuing antibiotics            encouraged.    ----------------------------     ------------------------------     PCT level increase compared          PCT >  0.5 ng/mL         with peak PCT AND          PCT >= 0.5 ng/mL             Escalation of antibiotics                                          strongly encouraged.      Escalation of antibiotics        strongly encouraged.   Magnesium     Status: None   Collection Time: 02/15/16  2:43 AM   Result Value Ref Range   Magnesium 1.8 1.7 - 2.4 mg/dL  CBC with Differential/Platelet     Status: Abnormal   Collection Time: 02/15/16  2:43 AM  Result Value Ref Range   WBC 11.6 (H) 4.0 - 10.5 K/uL   RBC 4.17 3.87 - 5.11 MIL/uL   Hemoglobin 11.8 (L) 12.0 - 15.0 g/dL   HCT 37.6 36.0 - 46.0 %   MCV 90.2 78.0 - 100.0 fL   MCH 28.3 26.0 - 34.0 pg   MCHC 31.4 30.0 - 36.0 g/dL   RDW 15.7 (H) 11.5 - 15.5 %   Platelets 265 150 - 400 K/uL   Neutrophils Relative % 75 %   Neutro Abs 8.7 (H) 1.7 - 7.7 K/uL   Lymphocytes Relative 15 %   Lymphs Abs 1.8 0.7 - 4.0 K/uL   Monocytes Relative 9 %   Monocytes Absolute 1.0 0.1 - 1.0 K/uL   Eosinophils Relative 1 %   Eosinophils Absolute 0.1 0.0 - 0.7 K/uL   Basophils Relative 0 %   Basophils Absolute 0.0 0.0 - 0.1 K/uL  Hepatic function panel     Status: Abnormal   Collection Time: 02/15/16  2:43 AM  Result Value Ref Range   Total Protein 4.7 (L) 6.5 - 8.1 g/dL   Albumin 2.4 (L) 3.5 - 5.0 g/dL   AST 22 15 - 41 U/L   ALT 15 14 - 54 U/L   Alkaline Phosphatase 48 38 - 126 U/L   Total Bilirubin 0.8 0.3 - 1.2 mg/dL   Bilirubin, Direct 0.1 0.1 - 0.5 mg/dL   Indirect Bilirubin 0.7 0.3 - 0.9 mg/dL  Renal function panel     Status: Abnormal   Collection Time: 02/15/16  3:00 AM  Result Value Ref Range   Sodium 136 135 - 145 mmol/L   Potassium 4.0 3.5 - 5.1 mmol/L   Chloride 107 101 - 111 mmol/L   CO2 24 22 - 32 mmol/L   Glucose, Bld 89 65 - 99 mg/dL   BUN <5 (L) 6 - 20 mg/dL   Creatinine, Ser 0.78 0.44 - 1.00 mg/dL   Calcium 7.9 (L) 8.9 - 10.3 mg/dL   Phosphorus 3.0 2.5 - 4.6 mg/dL   Albumin 2.5 (L) 3.5 - 5.0 g/dL   GFR calc non Af Amer >60 >60 mL/min   GFR calc Af Amer >60 >60 mL/min    Comment: (NOTE) The eGFR has been calculated using the CKD EPI equation. This calculation has not been validated in all clinical situations. eGFR's persistently <60 mL/min signify possible Chronic Kidney Disease.    Anion gap 5 5 - 15  Procalcitonin      Status: None   Collection Time: 02/16/16  6:25 AM  Result Value Ref Range   Procalcitonin <0.10 ng/mL  Comment:        Interpretation: PCT (Procalcitonin) <= 0.5 ng/mL: Systemic infection (sepsis) is not likely. Local bacterial infection is possible. (NOTE)         ICU PCT Algorithm               Non ICU PCT Algorithm    ----------------------------     ------------------------------         PCT < 0.25 ng/mL                 PCT < 0.1 ng/mL     Stopping of antibiotics            Stopping of antibiotics       strongly encouraged.               strongly encouraged.    ----------------------------     ------------------------------       PCT level decrease by               PCT < 0.25 ng/mL       >= 80% from peak PCT       OR PCT 0.25 - 0.5 ng/mL          Stopping of antibiotics                                             encouraged.     Stopping of antibiotics           encouraged.    ----------------------------     ------------------------------       PCT level decrease by              PCT >= 0.25 ng/mL       < 80% from peak PCT        AND PCT >= 0.5 ng/mL            Continuin g antibiotics                                              encouraged.       Continuing antibiotics            encouraged.    ----------------------------     ------------------------------     PCT level increase compared          PCT > 0.5 ng/mL         with peak PCT AND          PCT >= 0.5 ng/mL             Escalation of antibiotics                                          strongly encouraged.      Escalation of antibiotics        strongly encouraged.   Renal function panel     Status: Abnormal   Collection Time: 02/16/16  6:25 AM  Result Value Ref Range   Sodium 136 135 - 145 mmol/L   Potassium 3.6 3.5 - 5.1 mmol/L   Chloride 108 101 - 111 mmol/L   CO2 23 22 - 32  mmol/L   Glucose, Bld 115 (H) 65 - 99 mg/dL   BUN <5 (L) 6 - 20 mg/dL   Creatinine, Ser 0.79 0.44 - 1.00 mg/dL   Calcium 8.4 (L) 8.9  - 10.3 mg/dL   Phosphorus 2.9 2.5 - 4.6 mg/dL   Albumin 2.5 (L) 3.5 - 5.0 g/dL   GFR calc non Af Amer >60 >60 mL/min   GFR calc Af Amer >60 >60 mL/min    Comment: (NOTE) The eGFR has been calculated using the CKD EPI equation. This calculation has not been validated in all clinical situations. eGFR's persistently <60 mL/min signify possible Chronic Kidney Disease.    Anion gap 5 5 - 15  Magnesium     Status: None   Collection Time: 02/16/16  6:25 AM  Result Value Ref Range   Magnesium 1.7 1.7 - 2.4 mg/dL  CBC with Differential/Platelet     Status: None   Collection Time: 02/16/16  6:25 AM  Result Value Ref Range   WBC 9.2 4.0 - 10.5 K/uL   RBC 4.34 3.87 - 5.11 MIL/uL   Hemoglobin 12.2 12.0 - 15.0 g/dL   HCT 38.1 36.0 - 46.0 %   MCV 87.8 78.0 - 100.0 fL   MCH 28.1 26.0 - 34.0 pg   MCHC 32.0 30.0 - 36.0 g/dL   RDW 14.9 11.5 - 15.5 %   Platelets 266 150 - 400 K/uL   Neutrophils Relative % 68 %   Neutro Abs 6.2 1.7 - 7.7 K/uL   Lymphocytes Relative 21 %   Lymphs Abs 1.9 0.7 - 4.0 K/uL   Monocytes Relative 9 %   Monocytes Absolute 0.9 0.1 - 1.0 K/uL   Eosinophils Relative 2 %   Eosinophils Absolute 0.2 0.0 - 0.7 K/uL   Basophils Relative 0 %   Basophils Absolute 0.0 0.0 - 0.1 K/uL    Current Facility-Administered Medications  Medication Dose Route Frequency Provider Last Rate Last Dose  . 0.9 %  sodium chloride infusion  250 mL Intravenous PRN Rahul P Desai, PA-C      . DULoxetine (CYMBALTA) DR capsule 30 mg  30 mg Oral Daily Ambrose Finland, MD   30 mg at 02/16/16 0858  . feeding supplement (ENSURE ENLIVE) (ENSURE ENLIVE) liquid 237 mL  237 mL Oral BID BM Javier Glazier, MD   237 mL at 02/16/16 0859  . folic acid (FOLVITE) tablet 1 mg  1 mg Oral Daily Javier Glazier, MD   1 mg at 02/16/16 0858  . heparin injection 5,000 Units  5,000 Units Subcutaneous Q8H Rahul P Desai, PA-C   5,000 Units at 02/16/16 1301  . ondansetron (ZOFRAN) injection 4 mg  4 mg  Intravenous Q6H PRN Javier Glazier, MD      . phenol [facility]) mouth spray 1 spray  1 spray Mouth/Throat PRN Javier Glazier, MD      . prazosin (MINIPRESS) capsule 2 mg  2 mg Oral QHS Ambrose Finland, MD   2 mg at 02/15/16 2142  . thiamine (VITAMIN B-1) tablet 100 mg  100 mg Oral Daily Javier Glazier, MD   100 mg at 02/16/16 0858  . traZODone (DESYREL) tablet 50 mg  50 mg Oral QHS Ambrose Finland, MD   50 mg at 02/15/16 2141    Musculoskeletal: Strength & Muscle Tone: within normal limits Gait & Station: normal Patient leans: N/A  Psychiatric Specialty Exam: Physical Exam  ROS  No chest pain, no shortness of breath, denies vomiting today  Blood pressure 101/63, pulse 88, temperature 98.4 F (36.9 C), temperature source Oral, resp. rate 18, height '5\' 7"'$  (1.702 m), weight 120 lb 9.5 oz (54.7 kg), SpO2 99 %.Body mass index is 18.89 kg/m.  General Appearance: Fairly Groomed  Eye Contact:  Good  Speech:  Normal Rate  Volume:  Normal  Mood:  denies significant depression, states " I am feeling OK today"  Affect:  Appropriate, slightly anxious  Thought Process:  Linear  Orientation:  Full (Time, Place, and Person)  Thought Content:  denies hallucinations, no delusions   Suicidal Thoughts:  denies suicidal ideations, denies any self injurious ideations, no homicidal or violent ideations   Homicidal Thoughts:  No  Memory:  recent and remote grossly intact   Judgement:  Fair  Insight:  Fair  Psychomotor Activity:  Normal- no symptoms of withdrawal, no tremors, no diaphoresis  Concentration:  Concentration: Good and Attention Span: Good  Recall:  Good  Fund of Knowledge:  Good  Language:  Good  Akathisia:  Negative  Handed:  Right  AIMS (if indicated):     Assets:  Communication Skills Desire for Improvement Resilience  ADL's:  Improving   Cognition:  WNL  Sleep:      Assessment- patient reports recent overdoses were accidental, in the context of  taking Lyrica - as prescribed-for pain (in combination with alcohol ). Denies suicidal intent. Denies suicidal  Or self injurious ideations and is future oriented, wanting to go to an inpatient eating disorder unit soon . States she will not take [place] again" I know I can't handle it " and that father will dispose of it . Behavior at this time is calm and in good control. As noted, she has had two serious overdoses over recent days and her outpatient therapist is expressing high level of concern.    Treatment Plan Summary: At present  I do not think there are grounds for involuntary commitment , but I do think that patient's recent overdoses and worsening of eating disorder are concerning and that she  will benefit from inpatient psychiatric admission, and have encouraged her to consider this option , while she continues to seek  Longer term , specialized treatment for her eating disorder.  Patient and father stated they would consider this option     Disposition: Recommend psychiatric Inpatient admission when medically cleared. Psychiatry consult will follow with you, and re-evaluate if there are grounds for commitment at the time  Agree with Cymbalta trial- currently at 30 mgrs QDAY  Consider decreasing Minipress to 1 mgr QHS to minimize risk of orthostasis .   Neita Garnet, MD 02/16/2016 1:52 PM

## 2016-02-17 DIAGNOSIS — F411 Generalized anxiety disorder: Secondary | ICD-10-CM

## 2016-02-17 DIAGNOSIS — F509 Eating disorder, unspecified: Secondary | ICD-10-CM

## 2016-02-17 NOTE — Progress Notes (Addendum)
PROGRESS NOTE  Michelle Juarez VAP:[id number] DOB: Mar 14, 1986 DOA: 02/13/2016 PCP: [facility]  HPI/Recap of past 24 hours:  Anxious, want to leave the hospital, father not in room during encounter today  Assessment/Plan: Active Problems:   Acute respiratory failure (HCC)  Overdose with prescription drug with consumption of alcohol, patient denies suicidal ideation, though she has history of this.  She was intubated due to encephalopathy by EMS and admitted to icu on 7/27 just three days after being discharged from inpatient psych hospital [facility] in [place]. She is transferred to hospitalist service on 7/30 due to icu attending didnot think she can be safely discharged from the hospital even though psych has cleared her on 7/28.  H/o anxiety/depression/bipolar/eating disorder: she has followed by an outpatient therapiest [name] [phone]) who has known the patient for the last 7 years. Michelle Juarez wrote a letter expressing concerns about patient 's safety. She wrote in her letter " it is imperative that Michelle Juarez is not discharged from the hospital until she can be directly transferred to a residential or inpatient eating disorder treatment center." " Michelle Juarez also is very motivated to avoid intensive treatment and will conceal life threatheing behaviors to do so" Michelle Juarez who have known patient for years strongly believes that patient is a "dangerous to self". Michelle Juarez letter is placed in patient's paper chart.  Psychiatry reconsulted on 7/30 who recommended inpatient psych placement. Currently patient denies suicidal ideation, not IVCed, but per communication with psych,  patient should be IVCed should she decides to leave the hospital AMA.  Currently patient is medically stable to discharge to psych wards, I have called and updated psych consulting MD today Dr Salley Scarlet today. Appreciate Dr Vanetta Shawl input.   Code Status:  full  Family Communication: patient   Disposition Plan: pending   Consultants:  psychiatry  Procedures:  Intubation and extubation  Antibiotics:  none   Objective: BP 112/70 (BP Location: Left Arm)   Pulse 85   Temp 98.3 F (36.8 C) (Oral)   Resp 20   Ht 5\' 7"  (1.702 m)   Wt 55.8 kg (123 lb 0.3 oz)   LMP  (LMP Unknown)   SpO2 100%   BMI 19.27 kg/m  No intake or output data in the 24 hours ending 02/17/16 1450 Filed Weights   02/15/16 1132 02/16/16 0547 02/17/16 0553  Weight: 59.5 kg (131 lb 1.6 oz) 54.7 kg (120 lb 9.5 oz) 55.8 kg (123 lb 0.3 oz)    Exam:   General:  Anxious   Cardiovascular: RRR  Respiratory: CTABL  Abdomen: Soft/ND/NT, positive BS  Musculoskeletal: No Edema  Neuro: aaox3   Data Reviewed: Basic Metabolic Panel:  Recent Labs Lab 02/13/16 1650 02/14/16 0415 02/15/16 0243 02/15/16 0300 02/16/16 0625  NA 137 141  --  136 136  K 3.5 4.4  --  4.0 3.6  CL 109 111  --  107 108  CO2 24 24  --  24 23  GLUCOSE 71 85  --  89 115*  BUN 6 <5*  --  <5* <5*  CREATININE 0.98 0.88  --  0.78 0.79  CALCIUM 8.6* 8.1*  --  7.9* 8.4*  MG  --  1.6* 1.8  --  1.7  PHOS  --  1.6*  --  3.0 2.9   Liver Function Tests:  Recent Labs Lab 02/13/16 1650 02/15/16 0243 02/15/16 0300 02/16/16 0625  AST 21 22  --   --  ALT 14 15  --   --   ALKPHOS 44 48  --   --   BILITOT 0.2* 0.8  --   --   PROT 5.4* 4.7*  --   --   ALBUMIN 3.0* 2.4* 2.5* 2.5*   No results for input(s): LIPASE, AMYLASE in the last 168 hours. No results for input(s): AMMONIA in the last 168 hours. CBC:  Recent Labs Lab 02/13/16 1650 02/14/16 0415 02/15/16 0243 02/16/16 0625  WBC 8.6 8.3 11.6* 9.2  NEUTROABS 5.4  --  8.7* 6.2  HGB 11.2* 11.7* 11.8* 12.2  HCT 35.0* 35.9* 37.6 38.1  MCV 88.6 87.6 90.2 87.8  PLT 224 290 265 266   Cardiac Enzymes:   No results for input(s): CKTOTAL, CKMB, CKMBINDEX, TROPONINI in the last 168 hours. BNP (last 3 results) No results for  input(s): BNP in the last 8760 hours.  ProBNP (last 3 results) No results for input(s): PROBNP in the last 8760 hours.  CBG:  Recent Labs Lab 02/13/16 1625  GLUCAP 81    Recent Results (from the past 240 hour(s))  MRSA PCR Screening     Status: None   Collection Time: 02/13/16 10:33 PM  Result Value Ref Range Status   MRSA by PCR NEGATIVE NEGATIVE Final    Comment:        The GeneXpert MRSA Assay (FDA approved for NASAL specimens only), is one component of a comprehensive MRSA colonization surveillance program. It is not intended to diagnose MRSA infection nor to guide or monitor treatment for MRSA infections.      Studies: No results found.  Scheduled Meds: . DULoxetine  30 mg Oral Daily  . feeding supplement (ENSURE ENLIVE)  237 mL Oral BID BM  . folic acid  1 mg Oral Daily  . heparin  5,000 Units Subcutaneous Q8H  . prazosin  2 mg Oral QHS  . thiamine  100 mg Oral Daily  . traZODone  50 mg Oral QHS    Continuous Infusions:    Time spent: , more than 50% time spent of coordination of care  Selah Zelman MD, PhD  [facility] Pager [phone]. If 7PM-7AM, please contact night-coverage at [email or web address], password [facility] 02/17/2016, 2:50 PM  LOS: 4 days

## 2016-02-17 NOTE — Progress Notes (Addendum)
When pt is medically stable, [facility], [facility], [facility], and [facility] do not currently have beds.   Osborne Casco Benjy Kana LCSWA [phone]

## 2016-02-17 NOTE — Progress Notes (Signed)
Received call from Dr. Neysa Hotter with regards to patient's placement. This writer will inquire if [facility] has beds for patient.   Melbourne Abts, LCSWA Disposition staff 02/17/2016 8:08 PM'
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
## 2016-02-18 NOTE — Progress Notes (Addendum)
currently patient is under IVC per psych recommendation. Patient is Medically cleared to discharge to inpatient psych facility. Appreciate psych input.  Detail please see my notes from 7/31.

## 2016-02-18 NOTE — Progress Notes (Signed)
-  [facility] is at capacity but will call CSW when bed opens up. -[facility] did not answer phone. -Left message for [facility].  -[facility] is at capacity. -[facility] is at capacity.  -[facility] only has one Ger[facility] bed. -Left message for [employer]. [facility] may have beds opening up. Faxed clinicals. -[facility] will look at referral. Faxed clinicals.   Osborne Casco Jenisse Vullo LCSWA [phone]

## 2016-02-18 NOTE — Progress Notes (Signed)
Informed Dr. Roda Shutters patient's refusal to wear telemetry, received verbal order to discontinue telemetry order.

## 2016-02-19 DIAGNOSIS — T50901A Poisoning by unspecified drugs, medicaments and biological substances, accidental (unintentional), initial encounter: Secondary | ICD-10-CM

## 2016-02-19 DIAGNOSIS — T50902D Poisoning by unspecified drugs, medicaments and biological substances, intentional self-harm, subsequent encounter: Secondary | ICD-10-CM

## 2016-02-19 LAB — BLOOD GAS, ARTERIAL
Acid-base deficit: 4 mmol/L — ABNORMAL HIGH (ref 0.0–2.0)
Bicarbonate: 18.8 mEq/L — ABNORMAL LOW (ref 20.0–24.0)
DRAWN BY: 441351
FIO2: 0.7
LHR: 20 {breaths}/min
O2 Saturation: 99.6 %
PATIENT TEMPERATURE: 98.6
PCO2 ART: 25 mmHg — AB (ref 35.0–45.0)
PEEP: 8 cmH2O
PH ART: 7.489 — AB (ref 7.350–7.450)
PO2 ART: 375 mmHg — AB (ref 80.0–100.0)
TCO2: 19.6 mmol/L (ref 0–100)
VT: 490 mL

## 2016-02-19 MED ORDER — LINACLOTIDE 145 MCG PO CAPS
145.0000 ug | ORAL_CAPSULE | Freq: Every day | ORAL | Status: DC
  Administered 2016-02-20 – 2016-02-25 (×6): 145 ug via ORAL
  Filled 2016-02-19 (×6): qty 1

## 2016-02-19 MED ORDER — POLYETHYLENE GLYCOL 3350 17 G PO PACK
17.0000 g | PACK | Freq: Two times a day (BID) | ORAL | Status: DC
  Administered 2016-02-19 – 2016-02-25 (×13): 17 g via ORAL
  Filled 2016-02-19 (×13): qty 1

## 2016-02-19 MED ORDER — BISACODYL 10 MG RE SUPP: 10.0000 mg | Freq: Every day | RECTAL | Status: DC | PRN

## 2016-02-19 NOTE — Progress Notes (Signed)
Nutrition Follow-up  DOCUMENTATION CODES:   Not applicable  INTERVENTION:   -Continue Ensure Enlive po BID, each supplement provides 350 kcal and 20 grams of protein  NUTRITION DIAGNOSIS:   Inadequate oral intake related to chronic illness (eating disorder) as evidenced by per patient/family report.  Progressing  GOAL:   Patient will meet greater than or equal to 90% of their needs  Progressing  MONITOR:   PO intake, Supplement acceptance, I & O's, Weight trends, Labs  REASON FOR ASSESSMENT:   Malnutrition Screening Tool    ASSESSMENT:   62F admitted for suspected overdose 7/27. UDS negative and ETOH positive in ED.   Pt continues to eat well. Noted meal completion 75-100%. Pt also accepeting Ensure supplements well- consumed this morning's dose per MAR. RD will continue supplements due to underweight status and hx of eating disorder.   Noted no documented BM since 02/13/16. Bowel regimen ordered today (miralax and dulcolax).   CSW following. Pt IVC; awaiting inpatient psychiatric placement. Per MD, pt is medically stable for discharge.   Labs reviewed. Mg, K, and Phos WDL.   Diet Order:  Diet regular Room service appropriate? Yes; Fluid consistency: Thin  Skin:  Reviewed, no issues  Last BM:  02/13/16  Height:   Ht Readings from Last 1 Encounters:  02/15/16 5\' 7"  (1.702 m)    Weight:   Wt Readings from Last 1 Encounters:  02/19/16 115 lb 8.3 oz (52.4 kg)    Ideal Body Weight:  61.3 kg  BMI:  Body mass index is 18.09 kg/m.  Estimated Nutritional Needs:   Kcal:  1700-1900  Protein:  75-85 grams  Fluid:  > 1.7 L/day  EDUCATION NEEDS:   No education needs identified at this time  Chananya Canizalez A. Mayford Knife, RD, LDN, CDE Pager: [phone] After hours Pager: [phone]

## 2016-02-19 NOTE — Progress Notes (Signed)
[facility] has denied the pt. They do not offer eating disorder services.  Osborne Casco Yui Mulvaney LCSWA [phone]

## 2016-02-19 NOTE — Progress Notes (Signed)
PROGRESS NOTE  Michelle Juarez ENI:[id number] DOB: 08/07/1985 DOA: 02/13/2016 PCP: [facility]  HPI/Recap of past 24 hours:  Evaluated by Psych 7-31 again, needs inpatient psychiatrist facility.  No bowel movement today.  She has been doing research for a facility. I will let her speak with our social worker for that   Assessment/Plan: Principal Problem:   Overdose Active Problems:   Acute respiratory failure (HCC)   Eating disorder  Overdose with prescription drug with consumption of alcohol, patient denies suicidal ideation, though she has history of this. She was intubated due to encephalopathy by EMS and admitted to icu on 7/27 just three days after being discharged from inpatient psych hospital [facility] in [place]. She is transferred to hospitalist service on 7/30 due to icu attending didnot think she can be safely discharged from the hospital even though psych has cleared her on 7/28.   Psychiatry reconsulted on 7/30 who recommended inpatient psych placement. Psych was consulted again 7-31, see detail note. Paper IVC submitted 7-31. Patient need to be transfer to inpatient psych facility   H/o anxiety/depression/bipolar/eating disorder: she has followed by an outpatient therapiest [name] [phone]) who has known the patient for the last 7 years. [name] wrote a letter expressing concerns about patient 's safety. She wrote in her letter " it is imperative that Michelle Juarez is not discharged from the hospital until she can be directly transferred to a residential or inpatient eating disorder treatment center." " Michelle Juarez also is very motivated to avoid intensive treatment and will conceal life threatheing behaviors to do so" [name] who have known patient for years strongly believes that patient is a "dangerous to self". Ms Michelle Juarez Hearing letter is placed in patient's paper chart.   Currently patient is medically stable to discharge to psych  wards.  SW to follow and help arrange placement,.  Psych to follow with patient, per patient request.   Code Status: full  Family Communication: patient , father was at bedside.   Disposition Plan: Awaiting psych placement.    Consultants:  psychiatry  Procedures:  Intubation and extubation  Antibiotics:  none   Objective: BP (!) 104/59 (BP Location: Right Arm)   Pulse (!) 103   Temp 99.6 F (37.6 C)   Resp 16   Ht 5\' 7"  (1.702 m)   Wt 52.4 kg (115 lb 8.3 oz)   LMP  (LMP Unknown)   SpO2 97%   BMI 18.09 kg/m   Intake/Output Summary (Last 24 hours) at 02/19/16 1404 Last data filed at 02/18/16 2233  Gross per 24 hour  Intake              760 ml  Output                0 ml  Net              760 ml   Filed Weights   02/17/16 0553 02/18/16 0556 02/19/16 0600  Weight: 55.8 kg (123 lb 0.3 oz) 51.1 kg (112 lb 10.5 oz) 52.4 kg (115 lb 8.3 oz)    Exam:   General:  Anxious   Cardiovascular: RRR  Respiratory: CTABL  Abdomen: Soft/ND/NT, positive BS  Musculoskeletal: No Edema  Neuro: aaox3   Data Reviewed: Basic Metabolic Panel:  Recent Labs Lab 02/13/16 1650 02/14/16 0415 02/15/16 0243 02/15/16 0300 02/16/16 0625  NA 137 141  --  136 136  K 3.5 4.4  --  4.0 3.6  CL 109 111  --  107 108  CO2 24 24  --  24 23  GLUCOSE 71 85  --  89 115*  BUN 6 <5*  --  <5* <5*  CREATININE 0.98 0.88  --  0.78 0.79  CALCIUM 8.6* 8.1*  --  7.9* 8.4*  MG  --  1.6* 1.8  --  1.7  PHOS  --  1.6*  --  3.0 2.9   Liver Function Tests:  Recent Labs Lab 02/13/16 1650 02/15/16 0243 02/15/16 0300 02/16/16 0625  AST 21 22  --   --   ALT 14 15  --   --   ALKPHOS 44 48  --   --   BILITOT 0.2* 0.8  --   --   PROT 5.4* 4.7*  --   --   ALBUMIN 3.0* 2.4* 2.5* 2.5*   No results for input(s): LIPASE, AMYLASE in the last 168 hours. No results for input(s): AMMONIA in the last 168 hours. CBC:  Recent Labs Lab 02/13/16 1650 02/14/16 0415 02/15/16 0243 02/16/16 0625    WBC 8.6 8.3 11.6* 9.2  NEUTROABS 5.4  --  8.7* 6.2  HGB 11.2* 11.7* 11.8* 12.2  HCT 35.0* 35.9* 37.6 38.1  MCV 88.6 87.6 90.2 87.8  PLT 224 290 265 266   Cardiac Enzymes:   No results for input(s): CKTOTAL, CKMB, CKMBINDEX, TROPONINI in the last 168 hours. BNP (last 3 results) No results for input(s): BNP in the last 8760 hours.  ProBNP (last 3 results) No results for input(s): PROBNP in the last 8760 hours.  CBG:  Recent Labs Lab 02/13/16 1625  GLUCAP 81    Recent Results (from the past 240 hour(s))  MRSA PCR Screening     Status: None   Collection Time: 02/13/16 10:33 PM  Result Value Ref Range Status   MRSA by PCR NEGATIVE NEGATIVE Final    Comment:        The GeneXpert MRSA Assay (FDA approved for NASAL specimens only), is one component of a comprehensive MRSA colonization surveillance program. It is not intended to diagnose MRSA infection nor to guide or monitor treatment for MRSA infections.      Studies: No results found.  Scheduled Meds: . DULoxetine  30 mg Oral Daily  . feeding supplement (ENSURE ENLIVE)  237 mL Oral BID BM  . folic acid  1 mg Oral Daily  . heparin  5,000 Units Subcutaneous Q8H  . [START ON 02/20/2016] linaclotide  145 mcg Oral Daily  . polyethylene glycol  17 g Oral BID  . prazosin  2 mg Oral QHS  . thiamine  100 mg Oral Daily  . traZODone  50 mg Oral QHS    Continuous Infusions:    Time spent: , more than 50% time spent of coordination of care  Hartley Barefoot A MD.  [facility] Pager [phone]. If 7PM-7AM, please contact night-coverage at [email or web address], password [facility] 02/19/2016, 2:04 PM  LOS: 6 days

## 2016-02-19 NOTE — Progress Notes (Signed)
CSW in contact with [facility] Darlina [phone]). Faxed pt release forms and clinicals for review. They are aware pt is IVC'd.  Osborne Casco Corena Tilson LCSWA [phone]

## 2016-02-20 LAB — AMYLASE: Amylase: 105 U/L — ABNORMAL HIGH (ref 28–100)

## 2016-02-20 LAB — LIPASE, BLOOD: Lipase: 45 U/L (ref 11–51)

## 2016-02-20 NOTE — Progress Notes (Signed)
PROGRESS NOTE  Michelle Juarez OZH:[id number] DOB: Aug 16, 1985 DOA: 02/13/2016 PCP: [facility]  HPI/Recap of past 24 hours: Had BM She doesn't understand why she s IVC, she wants to go home to get her things and from there she would go to rehab. I explain to her that her father can pack her things.    Assessment/Plan: Principal Problem:   Overdose Active Problems:   Acute respiratory failure (HCC)   Eating disorder  Overdose with prescription drug with consumption of alcohol, patient denies suicidal ideation, though she has history of this. She was intubated due to encephalopathy by EMS and admitted to icu on 7/27 just three days after being discharged from inpatient psych hospital [facility] in [place]. She is transferred to hospitalist service on 7/30 due to icu attending didnot think she can be safely discharged from the hospital even though psych has cleared her on 7/28.   Psychiatry reconsulted on 7/30 who recommended inpatient psych placement. Psych was consulted again 7-31, see detail note. Paper IVC submitted 7-31. Patient need to be transfer to inpatient psych facility   H/o anxiety/depression/bipolar/eating disorder: she has followed by an outpatient therapiest Michelle Juarez [phone]) who has known the patient for the last 7 years. Ms Michelle Juarez wrote a letter expressing concerns about patient 's safety. She wrote in her letter " it is imperative that Michelle Juarez is not discharged from the hospital until she can be directly transferred to a residential or inpatient eating disorder treatment center." " Michelle Juarez also is very motivated to avoid intensive treatment and will conceal life threatheing behaviors to do so" Ms Michelle Juarez who have known patient for years strongly believes that patient is a "dangerous to self". Ms Michelle Juarez letter is placed in patient's paper chart.   Currently patient is medically stable to discharge to psych wards.  SW to  follow and help arrange placement,.  Psych to follow with patient, per patient request.  Awaiting placement.   Constipation; continue with miralax.   Code Status: full  Family Communication: patient  Disposition Plan: Awaiting psych placement.    Consultants:  psychiatry  Procedures:  Intubation and extubation  Antibiotics:  none   Objective: BP 110/67 (BP Location: Right Arm)   Pulse 69   Temp 98.6 F (37 C) (Oral)   Resp 20   Ht 5\' 7"  (1.702 m)   Wt 53.3 kg (117 lb 9.6 oz)   LMP  (LMP Unknown)   SpO2 100%   BMI 18.42 kg/m   Intake/Output Summary (Last 24 hours) at 02/20/16 1355 Last data filed at 02/20/16 1334  Gross per 24 hour  Intake              960 ml  Output                0 ml  Net              960 ml   Filed Weights   02/18/16 0556 02/19/16 0600 02/20/16 0603  Weight: 51.1 kg (112 lb 10.5 oz) 52.4 kg (115 lb 8.3 oz) 53.3 kg (117 lb 9.6 oz)    Exam:   General:  Anxious   Cardiovascular: RRR  Respiratory: CTABL  Abdomen: Soft/ND/NT, positive BS  Musculoskeletal: No Edema  Neuro: aaox3   Data Reviewed: Basic Metabolic Panel:  Recent Labs Lab 02/13/16 1650 02/14/16 0415 02/15/16 0243 02/15/16 0300 02/16/16 0625  NA 137 141  --  136 136  K 3.5 4.4  --  4.0 3.6  CL 109 111  --  107 108  CO2 24 24  --  24 23  GLUCOSE 71 85  --  89 115*  BUN 6 <5*  --  <5* <5*  CREATININE 0.98 0.88  --  0.78 0.79  CALCIUM 8.6* 8.1*  --  7.9* 8.4*  MG  --  1.6* 1.8  --  1.7  PHOS  --  1.6*  --  3.0 2.9   Liver Function Tests:  Recent Labs Lab 02/13/16 1650 02/15/16 0243 02/15/16 0300 02/16/16 0625  AST 21 22  --   --   ALT 14 15  --   --   ALKPHOS 44 48  --   --   BILITOT 0.2* 0.8  --   --   PROT 5.4* 4.7*  --   --   ALBUMIN 3.0* 2.4* 2.5* 2.5*    Recent Labs Lab 02/20/16 1203  LIPASE 45  AMYLASE 105*   No results for input(s): AMMONIA in the last 168 hours. CBC:  Recent Labs Lab 02/13/16 1650 02/14/16 0415  02/15/16 0243 02/16/16 0625  WBC 8.6 8.3 11.6* 9.2  NEUTROABS 5.4  --  8.7* 6.2  HGB 11.2* 11.7* 11.8* 12.2  HCT 35.0* 35.9* 37.6 38.1  MCV 88.6 87.6 90.2 87.8  PLT 224 290 265 266   Cardiac Enzymes:   No results for input(s): CKTOTAL, CKMB, CKMBINDEX, TROPONINI in the last 168 hours. BNP (last 3 results) No results for input(s): BNP in the last 8760 hours.  ProBNP (last 3 results) No results for input(s): PROBNP in the last 8760 hours.  CBG:  Recent Labs Lab 02/13/16 1625  GLUCAP 81    Recent Results (from the past 240 hour(s))  MRSA PCR Screening     Status: None   Collection Time: 02/13/16 10:33 PM  Result Value Ref Range Status   MRSA by PCR NEGATIVE NEGATIVE Final    Comment:        The GeneXpert MRSA Assay (FDA approved for NASAL specimens only), is one component of a comprehensive MRSA colonization surveillance program. It is not intended to diagnose MRSA infection nor to guide or monitor treatment for MRSA infections.      Studies: No results found.  Scheduled Meds: . DULoxetine  30 mg Oral Daily  . feeding supplement (ENSURE ENLIVE)  237 mL Oral BID BM  . folic acid  1 mg Oral Daily  . heparin  5,000 Units Subcutaneous Q8H  . linaclotide  145 mcg Oral Daily  . polyethylene glycol  17 g Oral BID  . prazosin  2 mg Oral QHS  . thiamine  100 mg Oral Daily  . traZODone  50 mg Oral QHS    Continuous Infusions:    Time spent: , more than 50% time spent of coordination of care  Hartley Barefoot A MD.  [facility] Pager [phone]. If 7PM-7AM, please contact night-coverage at [email or web address], password [facility] 02/20/2016, 1:55 PM  LOS: 7 days

## 2016-02-20 NOTE — Care Management Note (Signed)
Case Management Note  Patient Details  Name: Felipe Elmendorf MRN: [id number] Date of Birth: 21-Nov-1985  Subjective/Objective:                    Action/Plan: Discharge today.  Expected Discharge Date:     02/20/2016             [facility]  In-House Referral:  Clinical Social Work  Discharge planning Services  CM Consult    Additional Comments:  Epifanio Lesches, RN 02/20/2016, 9:52 AM

## 2016-02-20 NOTE — Progress Notes (Signed)
CSW continues to work on placement- [facility] is reviewing clinicals but do not expect to make final decision about admission today  Reita May has requested lipase and amylase levels for further review- paged MD to request order  CSW continuing to follow  Burna Sis, LCSWA Clinical Social Worker [phone]

## 2016-02-20 NOTE — Progress Notes (Signed)
This RN assuming care of pt as of 2300. I agree with charted assessment. Pt currently sleeping with safety sitter at bedside. Mick Sell RN

## 2016-02-21 LAB — COMPREHENSIVE METABOLIC PANEL
ALBUMIN: 3.7 g/dL (ref 3.5–5.0)
ALT: 15 U/L (ref 14–54)
AST: 19 U/L (ref 15–41)
Alkaline Phosphatase: 57 U/L (ref 38–126)
Anion gap: 5 (ref 5–15)
BUN: 6 mg/dL (ref 6–20)
CHLORIDE: 104 mmol/L (ref 101–111)
CO2: 29 mmol/L (ref 22–32)
Calcium: 9.3 mg/dL (ref 8.9–10.3)
Creatinine, Ser: 0.94 mg/dL (ref 0.44–1.00)
GFR calc Af Amer: >60 mL/min (ref >60)
GFR calc non Af Amer: >60 mL/min (ref >60)
GLUCOSE: 81 mg/dL (ref 65–99)
POTASSIUM: 4.2 mmol/L (ref 3.5–5.1)
Sodium: 138 mmol/L (ref 135–145)
Total Bilirubin: 0.2 mg/dL — ABNORMAL LOW (ref 0.3–1.2)
Total Protein: 7.3 g/dL (ref 6.5–8.1)

## 2016-02-21 LAB — CBC WITH DIFFERENTIAL/PLATELET
Basophils Absolute: 0 10*3/uL (ref 0.0–0.1)
Basophils Relative: 0 %
Eosinophils Absolute: 0.1 10*3/uL (ref 0.0–0.7)
Eosinophils Relative: 2 %
HEMATOCRIT: 39.2 % (ref 36.0–46.0)
HEMOGLOBIN: 12.6 g/dL (ref 12.0–15.0)
LYMPHS ABS: 1 10*3/uL (ref 0.7–4.0)
Lymphocytes Relative: 14 %
MCH: 29 pg (ref 26.0–34.0)
MCHC: 32.1 g/dL (ref 30.0–36.0)
MCV: 90.1 fL (ref 78.0–100.0)
MONOS PCT: 11 %
Monocytes Absolute: 0.7 10*3/uL (ref 0.1–1.0)
NEUTROS ABS: 5 10*3/uL (ref 1.7–7.7)
NEUTROS PCT: 73 %
PLATELETS: 434 10*3/uL — AB (ref 150–400)
RBC: 4.35 MIL/uL (ref 3.87–5.11)
RDW: 15.2 % (ref 11.5–15.5)
WBC: 6.8 10*3/uL (ref 4.0–10.5)

## 2016-02-21 LAB — MAGNESIUM: Magnesium: 2 mg/dL (ref 1.7–2.4)

## 2016-02-21 LAB — PHOSPHORUS: Phosphorus: 3.6 mg/dL (ref 2.5–4.6)

## 2016-02-21 MED ORDER — QUETIAPINE FUMARATE ER 50 MG PO TB24
100.0000 mg | ORAL_TABLET | Freq: Every day | ORAL | Status: DC
  Administered 2016-02-21 – 2016-02-24 (×4): 100 mg via ORAL
  Filled 2016-02-21 (×4): qty 2

## 2016-02-21 MED ORDER — TRAZODONE HCL 100 MG PO TABS
100.0000 mg | ORAL_TABLET | Freq: Every day | ORAL | Status: DC
  Administered 2016-02-21 – 2016-02-24 (×4): 100 mg via ORAL
  Filled 2016-02-21 (×4): qty 1

## 2016-02-21 MED ORDER — TUBERCULIN PPD 5 UNIT/0.1ML ID SOLN
5.0000 [IU] | Freq: Once | INTRADERMAL | Status: AC
  Administered 2016-02-21: 5 [IU] via INTRADERMAL
  Filled 2016-02-21: qty 0.1
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
## 2016-02-21 NOTE — Progress Notes (Signed)
PROGRESS NOTE  Michelle Juarez CVU:[id number] DOB: 1986-05-01 DOA: 02/13/2016 PCP: [facility]  HPI/Recap of past 24 hours: No new complaints.    Assessment/Plan: Principal Problem:   Overdose Active Problems:   Acute respiratory failure (HCC)   Eating disorder  Overdose with prescription drug with consumption of alcohol, patient denies suicidal ideation, though she has history of this. She was intubated due to encephalopathy by EMS and admitted to icu on 7/27 just three days after being discharged from inpatient psych hospital [facility] in [place]. She is transferred to hospitalist service on 7/30 due to icu attending didnot think she can be safely discharged from the hospital even though psych has cleared her on 7/28.   Psychiatry reconsulted on 7/30 who recommended inpatient psych placement. Psych was consulted again 7-31, see detail note. Paper IVC submitted 7-31. Patient need to be transfer to inpatient psych facility   H/o anxiety/depression/bipolar/eating disorder: she has followed by an outpatient therapiest Michelle Juarez [phone]) who has known the patient for the last 7 years. Ms Michelle Juarez wrote a letter expressing concerns about patient 's safety. She wrote in her letter " it is imperative that Michelle Juarez is not discharged from the hospital until she can be directly transferred to a residential or inpatient eating disorder treatment center." " Michelle Juarez also is very motivated to avoid intensive treatment and will conceal life threatheing behaviors to do so" Ms Michelle Juarez who have known patient for years strongly believes that patient is a "dangerous to self". Ms Michelle Juarez letter is placed in patient's paper chart.   Currently patient is medically stable to discharge to psych wards.  SW to follow and help arrange placement,.  Psych to follow with patient, per patient request.  Awaiting placement.  Will order labs requested by [facility].    Constipation; continue with miralax.   Code Status: full  Family Communication: patient  Disposition Plan: Awaiting psych placement.    Consultants:  psychiatry  Procedures:  Intubation and extubation  Antibiotics:  none   Objective: BP 120/86 (BP Location: Left Arm)   Pulse 82   Temp 97.5 F (36.4 C) (Oral)   Resp 18   Ht 5\' 7"  (1.702 m)   Wt 53 kg (116 lb 12.8 oz)   LMP  (LMP Unknown)   SpO2 100%   BMI 18.29 kg/m   Intake/Output Summary (Last 24 hours) at 02/21/16 1614 Last data filed at 02/21/16 1300  Gross per 24 hour  Intake             1390 ml  Output                0 ml  Net             1390 ml   Filed Weights   02/19/16 0600 02/20/16 0603 02/21/16 0345  Weight: 52.4 kg (115 lb 8.3 oz) 53.3 kg (117 lb 9.6 oz) 53 kg (116 lb 12.8 oz)    Exam:   General:  Anxious   Cardiovascular: RRR  Respiratory: CTABL  Abdomen: Soft/ND/NT, positive BS  Musculoskeletal: No Edema  Neuro: aaox3   Data Reviewed: Basic Metabolic Panel:  Recent Labs Lab 02/15/16 0243 02/15/16 0300 02/16/16 0625 02/21/16 1442  NA  --  136 136 138  K  --  4.0 3.6 4.2  CL  --  107 108 104  CO2  --  24 23 29   GLUCOSE  --  89 115* 81  BUN  --  <  5* <5* 6  CREATININE  --  0.78 0.79 0.94  CALCIUM  --  7.9* 8.4* 9.3  MG 1.8  --  1.7 2.0  PHOS  --  3.0 2.9 3.6   Liver Function Tests:  Recent Labs Lab 02/15/16 0243 02/15/16 0300 02/16/16 0625 02/21/16 1442  AST 22  --   --  19  ALT 15  --   --  15  ALKPHOS 48  --   --  57  BILITOT 0.8  --   --  0.2*  PROT 4.7*  --   --  7.3  ALBUMIN 2.4* 2.5* 2.5* 3.7    Recent Labs Lab 02/20/16 1203  LIPASE 45  AMYLASE 105*   No results for input(s): AMMONIA in the last 168 hours. CBC:  Recent Labs Lab 02/15/16 0243 02/16/16 0625 02/21/16 1442  WBC 11.6* 9.2 6.8  NEUTROABS 8.7* 6.2 5.0  HGB 11.8* 12.2 12.6  HCT 37.6 38.1 39.2  MCV 90.2 87.8 90.1  PLT 265 266 434*   Cardiac Enzymes:   No results for  input(s): CKTOTAL, CKMB, CKMBINDEX, TROPONINI in the last 168 hours. BNP (last 3 results) No results for input(s): BNP in the last 8760 hours.  ProBNP (last 3 results) No results for input(s): PROBNP in the last 8760 hours.  CBG: No results for input(s): GLUCAP in the last 168 hours.  Recent Results (from the past 240 hour(s))  MRSA PCR Screening     Status: None   Collection Time: 02/13/16 10:33 PM  Result Value Ref Range Status   MRSA by PCR NEGATIVE NEGATIVE Final    Comment:        The GeneXpert MRSA Assay (FDA approved for NASAL specimens only), is one component of a comprehensive MRSA colonization surveillance program. It is not intended to diagnose MRSA infection nor to guide or monitor treatment for MRSA infections.      Studies: No results found.  Scheduled Meds: . DULoxetine  30 mg Oral Daily  . feeding supplement (ENSURE ENLIVE)  237 mL Oral BID BM  . folic acid  1 mg Oral Daily  . heparin  5,000 Units Subcutaneous Q8H  . linaclotide  145 mcg Oral Daily  . polyethylene glycol  17 g Oral BID  . prazosin  2 mg Oral QHS  . QUEtiapine  100 mg Oral QHS  . thiamine  100 mg Oral Daily  . traZODone  100 mg Oral QHS  . tuberculin  5 Units Intradermal Once    Continuous Infusions:    Time spent: , more than 50% time spent of coordination of care  Hartley Barefoot A MD.  [facility] Pager [phone]. If 7PM-7AM, please contact night-coverage at [email or web address], password [facility] 02/21/2016, 4:14 PM  LOS: 8 days

## 2016-02-21 NOTE — Care Management Note (Signed)
Case Management Note  Patient Details  Name: Michelle Juarez MRN: [id number] Date of Birth: 1986/05/09  Subjective/Objective:                    Action/Plan: Plan is possible d/c to [facility] today, awaiting acceptance.  Expected Discharge Date:   02/21/16           Expected Discharge Plan:  Psychiatric Hospital  In-House Referral:  Clinical Social Work  Discharge planning Services  CM Consult       Status of Service:  Completed, signed off  If discussed at Long Length of Stay Meetings, dates discussed:    Additional Comments:  Epifanio Lesches, RN 02/21/2016, 10:02 AM

## 2016-02-22 DIAGNOSIS — F509 Eating disorder, unspecified: Secondary | ICD-10-CM

## 2016-02-22 LAB — HEPATITIS PANEL, ACUTE
HCV Ab: <0.1 s/co ratio (ref 0.0–0.9)
HEP A IGM: NEGATIVE
HEP B C IGM: NEGATIVE
HEP B S AG: NEGATIVE

## 2016-02-22 MED ORDER — SENNA 8.6 MG PO TABS: 1.0000 | ORAL_TABLET | Freq: Every day | ORAL | Status: DC | PRN

## 2016-02-22 NOTE — Progress Notes (Signed)
PROGRESS NOTE  Michelle Juarez XGZ:[id number] DOB: Jun 15, 1986 DOA: 02/13/2016 PCP: [facility]  HPI/Recap of past 24 hours: She was not able to sleep last night.  Had very small BM yesterday   Assessment/Plan: Principal Problem:   Overdose Active Problems:   Acute respiratory failure (HCC)   Eating disorder  Overdose with prescription drug with consumption of alcohol, patient denies suicidal ideation, though she has history of this. She was intubated due to encephalopathy by EMS and admitted to icu on 7/27 just three days after being discharged from inpatient psych hospital [place]. She is transferred to hospitalist service on 7/30 due to icu attending didnot think she can be safely discharged from the hospital even though psych has cleared her on 7/28.   Psychiatry reconsulted on 7/30 who recommended inpatient psych placement. Psych was consulted again 7-31, see detail note. Paper IVC submitted 7-31. Patient need to be transfer to inpatient psych facility   H/o anxiety/depression/bipolar/eating disorder: she has followed by an outpatient therapiest Michelle Juarez [phone]) who has known the patient for the last 7 years. Ms Michelle Juarez wrote a letter expressing concerns about patient 's safety. She wrote in her letter " it is imperative that Michelle Juarez is not discharged from the hospital until she can be directly transferred to a residential or inpatient eating disorder treatment center." " Michelle Juarez also is very motivated to avoid intensive treatment and will conceal life threatheing behaviors to do so" Ms Michelle Juarez who have known patient for years strongly believes that patient is a "dangerous to self". Ms Michelle Juarez letter is placed in patient's paper chart.   Currently patient is medically stable to discharge to psych wards.  SW to follow and help arrange placement,.  Awaiting placement.  Awaiting bed, presume for Tuesday   Constipation;  continue with miralax.   Code Status: full  Family Communication: patient  Disposition Plan: Awaiting psych placement.    Consultants:  psychiatry  Procedures:  Intubation and extubation  Antibiotics:  none   Objective: BP 104/72 (BP Location: Left Arm)   Pulse 92   Temp 97.5 F (36.4 C) (Oral)   Resp 19   Ht 5\' 7"  (1.702 m)   Wt 53 kg (116 lb 12.4 oz)   LMP  (LMP Unknown)   SpO2 99%   BMI 18.29 kg/m   Intake/Output Summary (Last 24 hours) at 02/22/16 1342 Last data filed at 02/22/16 0936  Gross per 24 hour  Intake             1200 ml  Output                0 ml  Net             1200 ml   Filed Weights   02/20/16 0603 02/21/16 0345 02/22/16 0300  Weight: 53.3 kg (117 lb 9.6 oz) 53 kg (116 lb 12.8 oz) 53 kg (116 lb 12.4 oz)    Exam:   General:  Anxious   Cardiovascular: RRR  Respiratory: CTABL  Abdomen: Soft/ND/NT, positive BS  Musculoskeletal: No Edema  Neuro: aaox3   Data Reviewed: Basic Metabolic Panel:  Recent Labs Lab 02/16/16 0625 02/21/16 1442  NA 136 138  K 3.6 4.2  CL 108 104  CO2 23 29  GLUCOSE 115* 81  BUN <5* 6  CREATININE 0.79 0.94  CALCIUM 8.4* 9.3  MG 1.7 2.0  PHOS 2.9 3.6   Liver Function Tests:  Recent Labs Lab  02/16/16 0625 02/21/16 1442  AST  --  19  ALT  --  15  ALKPHOS  --  57  BILITOT  --  0.2*  PROT  --  7.3  ALBUMIN 2.5* 3.7    Recent Labs Lab 02/20/16 1203  LIPASE 45  AMYLASE 105*   No results for input(s): AMMONIA in the last 168 hours. CBC:  Recent Labs Lab 02/16/16 0625 02/21/16 1442  WBC 9.2 6.8  NEUTROABS 6.2 5.0  HGB 12.2 12.6  HCT 38.1 39.2  MCV 87.8 90.1  PLT 266 434*   Cardiac Enzymes:   No results for input(s): CKTOTAL, CKMB, CKMBINDEX, TROPONINI in the last 168 hours. BNP (last 3 results) No results for input(s): BNP in the last 8760 hours.  ProBNP (last 3 results) No results for input(s): PROBNP in the last 8760 hours.  CBG: No results for input(s): GLUCAP in  the last 168 hours.  Recent Results (from the past 240 hour(s))  MRSA PCR Screening     Status: None   Collection Time: 02/13/16 10:33 PM  Result Value Ref Range Status   MRSA by PCR NEGATIVE NEGATIVE Final    Comment:        The GeneXpert MRSA Assay (FDA approved for NASAL specimens only), is one component of a comprehensive MRSA colonization surveillance program. It is not intended to diagnose MRSA infection nor to guide or monitor treatment for MRSA infections.      Studies: No results found.  Scheduled Meds: . DULoxetine  30 mg Oral Daily  . feeding supplement (ENSURE ENLIVE)  237 mL Oral BID BM  . folic acid  1 mg Oral Daily  . heparin  5,000 Units Subcutaneous Q8H  . linaclotide  145 mcg Oral Daily  . polyethylene glycol  17 g Oral BID  . prazosin  2 mg Oral QHS  . QUEtiapine  100 mg Oral QHS  . thiamine  100 mg Oral Daily  . traZODone  100 mg Oral QHS  . tuberculin  5 Units Intradermal Once    Continuous Infusions:    Time spent: , more than 50% time spent of coordination of care  Hartley Barefoot A MD.  [facility] Pager [phone]. If 7PM-7AM, please contact night-coverage at [email or web address], password [facility] 02/22/2016, 1:42 PM  LOS: 9 days

## 2016-02-23 MED ORDER — NAPROXEN SODIUM 275 MG PO TABS
275.0000 mg | ORAL_TABLET | Freq: Two times a day (BID) | ORAL | Status: DC | PRN
Start: 2016-02-23 — End: 2016-02-25
  Administered 2016-02-23 – 2016-02-24 (×3): 275 mg via ORAL
  Filled 2016-02-23 (×4): qty 1

## 2016-02-23 MED ORDER — CYCLOBENZAPRINE HCL 5 MG PO TABS
5.0000 mg | ORAL_TABLET | Freq: Three times a day (TID) | ORAL | Status: DC | PRN
  Administered 2016-02-23: 5 mg via ORAL
  Filled 2016-02-23: qty 1

## 2016-02-23 NOTE — Progress Notes (Signed)
PPD from TB skin test negative.

## 2016-02-23 NOTE — Progress Notes (Signed)
PROGRESS NOTE  Michelle KosJessica Juarez EAV:[id number] DOB: 06/15/1986 DOA: 02/13/2016 PCP: [facility]  HPI/Recap of past 24 hours: Complaining of neck pain.   Assessment/Plan: Principal Problem:   Overdose Active Problems:   Acute respiratory failure (HCC)   Eating disorder  Overdose with prescription drug with consumption of alcohol, patient denies suicidal ideation, though she has history of this. She was intubated due to encephalopathy by EMS and admitted to icu on 7/27 just three days after being discharged from inpatient psych hospital [facility] in [place]. She is transferred to hospitalist service on 7/30 due to icu attending didnot think she can be safely discharged from the hospital even though psych has cleared her on 7/28.   Psychiatry reconsulted on 7/30 who recommended inpatient psych placement. Psych was consulted again 7-31, see detail note. Paper IVC submitted 7-31. Patient need to be transfer to inpatient psych facility   H/o anxiety/depression/bipolar/eating disorder: she has followed by an outpatient therapiest Michelle Juarez [phone] who has known the patient for the last 7 years. Ms Michelle SaupeHeather Juarez wrote a letter expressing concerns about patient 's safety. She wrote in her letter " it is imperative that Michelle Juarez is not discharged from the hospital until she can be directly transferred to a residential or inpatient eating disorder treatment center." " Michelle Juarez also is very motivated to avoid intensive treatment and will conceal life threatheing behaviors to do so" Ms Michelle SaupeHeather Juarez who have known patient for years strongly believes that patient is a "dangerous to self". Ms Consepcion HearingHeather Juarez's letter is placed in patient's paper chart.   Currently patient is medically stable to discharge to psych wards.  SW to follow and help arrange placement,.  Awaiting placement.  Awaiting bed, presume for Tuesday   Constipation; continue with miralax.  Neck pain; started  flexeril, will need to taper.   Code Status: full  Family Communication: patient  Disposition Plan: Awaiting psych placement.    Consultants:  psychiatry  Procedures:  Intubation and extubation  Antibiotics:  none   Objective: BP (!) 94/58 (BP Location: Left Arm)   Pulse 75   Temp 98 F (36.7 C) (Oral)   Resp 18   Ht 5\' 7"  (1.702 m)   Wt 53.6 kg (118 lb 1.6 oz)   LMP  (LMP Unknown)   SpO2 98%   BMI 18.50 kg/m   Intake/Output Summary (Last 24 hours) at 02/23/16 1403 Last data filed at 02/23/16 1300  Gross per 24 hour  Intake             1560 ml  Output                0 ml  Net             1560 ml   Filed Weights   02/21/16 0345 02/22/16 0300 02/23/16 0526  Weight: 53 kg (116 lb 12.8 oz) 53 kg (116 lb 12.4 oz) 53.6 kg (118 lb 1.6 oz)    Exam:   General:  Anxious   Cardiovascular: RRR  Respiratory: CTABL  Abdomen: Soft/ND/NT, positive BS  Musculoskeletal: No Edema  Neuro: aaox3   Data Reviewed: Basic Metabolic Panel:  Recent Labs Lab 02/21/16 1442  NA 138  K 4.2  CL 104  CO2 29  GLUCOSE 81  BUN 6  CREATININE 0.94  CALCIUM 9.3  MG 2.0  PHOS 3.6   Liver Function Tests:  Recent Labs Lab 02/21/16 1442  AST 19  ALT 15  ALKPHOS 57  BILITOT 0.2*  PROT 7.3  ALBUMIN 3.7    Recent Labs Lab 02/20/16 1203  LIPASE 45  AMYLASE 105*   No results for input(s): AMMONIA in the last 168 hours. CBC:  Recent Labs Lab 02/21/16 1442  WBC 6.8  NEUTROABS 5.0  HGB 12.6  HCT 39.2  MCV 90.1  PLT 434*   Cardiac Enzymes:   No results for input(s): CKTOTAL, CKMB, CKMBINDEX, TROPONINI in the last 168 hours. BNP (last 3 results) No results for input(s): BNP in the last 8760 hours.  ProBNP (last 3 results) No results for input(s): PROBNP in the last 8760 hours.  CBG: No results for input(s): GLUCAP in the last 168 hours.  Recent Results (from the past 240 hour(s))  MRSA PCR Screening     Status: None   Collection Time: 02/13/16  10:33 PM  Result Value Ref Range Status   MRSA by PCR NEGATIVE NEGATIVE Final    Comment:        The GeneXpert MRSA Assay (FDA approved for NASAL specimens only), is one component of a comprehensive MRSA colonization surveillance program. It is not intended to diagnose MRSA infection nor to guide or monitor treatment for MRSA infections.      Studies: No results found.  Scheduled Meds: . DULoxetine  30 mg Oral Daily  . feeding supplement (ENSURE ENLIVE)  237 mL Oral BID BM  . folic acid  1 mg Oral Daily  . heparin  5,000 Units Subcutaneous Q8H  . linaclotide  145 mcg Oral Daily  . polyethylene glycol  17 g Oral BID  . prazosin  2 mg Oral QHS  . QUEtiapine  100 mg Oral QHS  . thiamine  100 mg Oral Daily  . traZODone  100 mg Oral QHS  . tuberculin  5 Units Intradermal Once    Continuous Infusions:    Time spent: , more than 50% time spent of coordination of care  Hartley Barefoot A MD.  [facility] Pager [phone]. If 7PM-7AM, please contact night-coverage at [email or web address], password [facility] 02/23/2016, 2:03 PM  LOS: 10 days

## 2016-02-23 NOTE — Progress Notes (Signed)
Cross Coverage Note  Called regarding involuntary commitment for patient.  IVC expires tonight.   Chart reviewed.  Patient with bipolar affective disorder and eating disorder as well as personal trauma and history of overdose presented with altered mental status and was intubated.  Per chart, although she reported lack of self-injurious intent, this was judged unreliable by collateral witnesses/family/attending medical and psychiatric providers.  As recently as today she was unable to contract for safety and involuntary commitment was recommended by her attending provider and psychiatric provider.  I spoke with her Psychiatrist Dr. Elsie SaasJonnalagadda by phone who confirmed that she was an imminent risk to herelf should she leave the hospital and should continue to be committed.  Paperwork were completed.

## 2016-02-24 NOTE — Care Management Note (Addendum)
Case Management Note  Patient Details  Name: Michelle KosJessica Juarez MRN: [id number] Date of Birth: [birth year]  Subjective/Objective:                    Action/Plan: Awaiting placement for psych bed  Expected Discharge Date:                  Expected Discharge Plan:  Psychiatric Hospital  In-House Referral:  Clinical Social Work  Discharge planning Services  CM Consult Status of Service:  Completed, signed off  If discussed at [employer] Length of Stay Meetings, dates discussed:    Additional Comments:  Epifanio LeschesCole, Makynzie Dobesh Hudson, RN 02/24/2016, 10:18 AM

## 2016-02-24 NOTE — Progress Notes (Signed)
[facility] [phone]) at 10am on Tuesday. Pt will go via [employer] if still IVC'd.  Osborne Cascoadia Tyler Robidoux LCSWA [phone]

## 2016-02-24 NOTE — Progress Notes (Signed)
Nutrition Follow-up  DOCUMENTATION CODES:   Not applicable  INTERVENTION:   -Continue Ensure Enlive po BID, each supplement provides 350 kcal and 20 grams of protein  NUTRITION DIAGNOSIS:   Inadequate oral intake related to chronic illness (eating disorder) as evidenced by per patient/family report.  Progressing  GOAL:   Patient will meet greater than or equal to 90% of their needs  Met  MONITOR:   PO intake, Supplement acceptance, I & O's, Weight trends, Labs  REASON FOR ASSESSMENT:   Malnutrition Screening Tool    ASSESSMENT:   7F admitted for suspected overdose 7/27. UDS negative and ETOH positive in ED.   Pt in with nurse tech at time of visit, sitting in recliner chair at time of visit.   Pt continues to have good oral intake. Meal completion 75-100%. Pt continues to accept Ensure supplements well. Noted pt consumed 100% morning dose of Ensure. Observed breakfast meal tray, which pt at 100%.   Per MD notes, IVC paperwork renewed.   CSW following for inpatient psych placement. Plan to [facility] on [facility]/25/16.   Labs reviewed.   Diet Order:  Diet regular Room service appropriate? Yes; Fluid consistency: Thin  Skin:  Reviewed, no issues  Last BM:  02/23/16  Height:   Ht Readings from Last 1 Encounters:  02/15/16 '5\' 7"'$  (1.702 m)    Weight:   Wt Readings from Last 1 Encounters:  02/24/16 119 lb 3.2 oz (54.1 kg)    Ideal Body Weight:  61.3 kg  BMI:  Body mass index is 18.67 kg/m.  Estimated Nutritional Needs:   Kcal:  1700-1900  Protein:  75-85 grams  Fluid:  > 1.7 L/day  EDUCATION NEEDS:   No education needs identified at this time  Minetta Krisher A. Jimmye Norman, RD, LDN, CDE Pager: [phone] After hours Pager: [phone]

## 2016-02-24 NOTE — Progress Notes (Signed)
PROGRESS NOTE  Michelle Juarez ZOX:[id number] DOB: [birth year] DOA: 02/13/2016 PCP: [facility]  HPI/Recap of past 24 hours: Neck pain is better.    Assessment/Plan: Principal Problem:   Overdose Active Problems:   Acute respiratory failure (HCC)   Eating disorder  Overdose with prescription drug with consumption of alcohol, patient denies suicidal ideation, though she has history of this. She was intubated due to encephalopathy by EMS and admitted to icu on 7/27 just three days after being discharged from inpatient psych hospital [facility] in [place]. She is transferred to hospitalist service on 7/30 due to icu attending didnot think she can be safely discharged from the hospital even though psych has cleared her on 7/28.   Psychiatry reconsulted on 7/30 who recommended inpatient psych placement. Psych was consulted again 7-31, see detail note. Paper IVC submitted 7-31. Patient need to be transfer to inpatient psych facility   H/o anxiety/depression/bipolar/eating disorder: she has followed by an outpatient therapiest [name] [phone]) who has known the patient for the last 7 years. [name] wrote a letter expressing concerns about patient 's safety. She wrote in her letter " it is imperative that [name] is not discharged from the hospital until she can be directly transferred to a residential or inpatient eating disorder treatment center." " Charyl also is very motivated to avoid intensive treatment and will conceal life threatheing behaviors to do so" [name] who have known patient for years strongly believes that patient is a "dangerous to self". [name] letter is placed in patient's paper chart.   Currently patient is medically stable to discharge to psych wards.  SW to follow and help arrange placement,.  Awaiting placement.  Awaiting bed, presume for Tuesday   Constipation; continue with miralax.  Neck pain; started  flexeril, will need to taper. Better   Code Status: full  Family Communication: patient  Disposition Plan: Awaiting psych placement.    Consultants:  psychiatry  Procedures:  Intubation and extubation  Antibiotics:  none   Objective: BP 107/68 (BP Location: Right Arm)   Pulse 79   Temp 97.7 F (36.5 C) (Oral)   Resp 18   Ht  (1.702 m)   Wt 54.1 kg (119 lb 3.2 oz)   LMP  (LMP Unknown)   SpO2 99%   BMI 18.67 kg/m   Intake/Output Summary (Last 24 hours) at 02/24/16 1255 Last data filed at 02/24/16 1019  Gross per 24 hour  Intake             1560 ml  Output                0 ml  Net             1560 ml   Filed Weights   02/22/16 0300 02/23/16 0526 02/24/16 0602  Weight: 53 kg (116 lb 12.4 oz) 53.6 kg (118 lb 1.6 oz) 54.1 kg (119 lb 3.2 oz)    Exam:   General:  Anxious   Cardiovascular: RRR  Respiratory: CTABL  Abdomen: Soft/ND/NT, positive BS  Musculoskeletal: No Edema  Neuro: aaox3   Data Reviewed: Basic Metabolic Panel:  Recent Labs Lab 02/21/16 1442  NA 138  K 4.2  CL 104  CO2 29  GLUCOSE 81  BUN 6  CREATININE 0.94  CALCIUM 9.3  MG 2.0  PHOS 3.6   Liver Function Tests:  Recent Labs Lab 02/21/16 1442  AST 19  ALT 15  ALKPHOS 57  BILITOT 0.2*  PROT 7.3  ALBUMIN 3.7    Recent Labs Lab 02/20/16 1203  LIPASE 45  AMYLASE 105*   No results for input(s): AMMONIA in the last 168 hours. CBC:  Recent Labs Lab 02/21/16 1442  WBC 6.8  NEUTROABS 5.0  HGB 12.6  HCT 39.2  MCV 90.1  PLT 434*   Cardiac Enzymes:   No results for input(s): CKTOTAL, CKMB, CKMBINDEX, TROPONINI in the last 168 hours. BNP (last 3 results) No results for input(s): BNP in the last 8760 hours.  ProBNP (last 3 results) No results for input(s): PROBNP in the last 8760 hours.  CBG: No results for input(s): GLUCAP in the last 168 hours.  No results found for this or any previous visit (from the past 240 hour(s)).   Studies: No results  found.  Scheduled Meds: . DULoxetine  30 mg Oral Daily  . feeding supplement (ENSURE ENLIVE)  237 mL Oral BID BM  . folic acid  1 mg Oral Daily  . heparin  5,000 Units Subcutaneous Q8H  . linaclotide  145 mcg Oral Daily  . polyethylene glycol  17 g Oral BID  . prazosin  2 mg Oral QHS  . QUEtiapine  100 mg Oral QHS  . thiamine  100 mg Oral Daily  . traZODone  100 mg Oral QHS    Continuous Infusions:    Time spent: 25mins , more than 50% time spent of coordination of care  Hartley Barefootegalado, Johara Lodwick A MD.  [facility] Pager [phone]. If 7PM-7AM, please contact night-coverage at [email or web address], password [email or web address] 02/24/2016, 12:55 PM  LOS: 11 days

## 2016-02-24 NOTE — Clinical Social Work Note (Addendum)
UPDATE 8/7 10:30a: Patient's intake coordinator, Dennard SchaumannSherise, who stated she has received updated clinicals and provided them to facility Medical Director. Per intake coordinator, once MD reviews updated clinicals the intake coordinator will be contacting LCSW to arrange for an admission date. T[facility]ATIV[facility] Tuesday at 10[facility].  [facility] [phone]) regarding patient's bed availability. LCSW spoke with intake personnel, Fleet Contrasachel, who stated patient's intake coordinator is currently on the phone and will return LCSW call once available.  Awaiting return call.  Marcelline DeistEmily Norman Piacentini, LCSW [phone] Orthopedic Social Worker

## 2016-02-25 MED ORDER — POLYETHYLENE GLYCOL 3350 17 G PO PACK: 17.0000 g | PACK | Freq: Two times a day (BID) | ORAL | 0 refills | Status: AC

## 2016-02-25 MED ORDER — THIAMINE HCL 100 MG PO TABS: 100.0000 mg | ORAL_TABLET | Freq: Every day | ORAL | 0 refills | Status: AC

## 2016-02-25 MED ORDER — ENSURE ENLIVE PO LIQD: 237.0000 mL | Freq: Two times a day (BID) | ORAL | 12 refills | Status: AC

## 2016-02-25 MED ORDER — DULOXETINE HCL 30 MG PO CPEP: 30.0000 mg | ORAL_CAPSULE | Freq: Every day | ORAL | 0 refills | Status: AC

## 2016-02-25 MED ORDER — FOLIC ACID 1 MG PO TABS: 1.0000 mg | ORAL_TABLET | Freq: Every day | ORAL | 0 refills | Status: AC

## 2016-02-25 MED ORDER — QUETIAPINE FUMARATE ER 50 MG PO TB24: 100.0000 mg | ORAL_TABLET | Freq: Every day | ORAL | 0 refills | Status: AC

## 2016-02-25 NOTE — Progress Notes (Signed)
[facility]. Patient and father informed.

## 2016-02-25 NOTE — Progress Notes (Signed)
CSW scheduled [facility] to transport patient to [facility] ([address] [place] by [employer]. According to the chart, patient was never served and therefore is going under voluntary commitment.   CSW signing off.  Osborne Cascoadia Aqsa Sensabaugh LCSWA [phone]

## 2016-02-25 NOTE — Discharge Summary (Signed)
Physician Discharge Summary  Amiri Tritch AOZ:[id number] DOB: [birth year] DOA: 02/13/2016  PCP: [facility]  Admit date: 02/13/2016 Discharge date: 02/25/2016  Admitted From: home  Disposition:  Discharge inpatient facility, [facility].   Recommendations for Outpatient Follow-up:  1. Follow up with PCP in 1-2 weeks 2. Please obtain BMP/CBC in one week 3. Please follow up on the following pending results:   Discharge Condition: stable.  CODE STATUS: full code.  Diet recommendation: Heart Healthy  Brief/Interim Summary: Assessment/Plan:   Overdose   Acute respiratory failure (HCC)   Eating disorder  Overdose with prescription drug with consumption of alcohol, patient denies suicidal ideation, though she has history of this. She was intubated due to encephalopathy by EMS and admitted to icu on 7/27 just three days after being discharged from inpatient psych hospital [facility] in [place]. She is transferred to hospitalist service on 7/30 due to icu attending didnot think she can be safely discharged from the hospital even though psych has cleared her on 7/28.   Psychiatry reconsulted on 7/30 who recommended inpatient psych placement. Psych was consulted again 7-31, see detail note. Paper IVC submitted 7-31. Patient need to be transfer to inpatient psych facility   H/o anxiety/depression/bipolar/eating disorder: she has followed by an outpatient therapiest [name] [phone]) who has known the patient for the last 7 years. [name] wrote a letter expressing concerns about patient 's safety. She wrote in her letter " it is imperative that Michelle Juarez is not discharged from the hospital until she can be directly transferred to a residential or inpatient eating disorder treatment center." " Safiyah also is very motivated to avoid intensive treatment and will conceal life threatheing behaviors to do so" [name] who have known patient for years strongly  believes that patient is a "dangerous to self". [name] letter is placed in patient's paper chart.   Currently patient is medically stable to discharge to psych wards.  SW to follow and help arrange placement,.  Awaiting placement.  Awaiting bed, presume for Tuesday   Constipation; continue with miralax.  Neck pain; started flexeril prn. Better   Code Status: full  Family Communication: patient  Disposition Plan: Awaiting psych placement.    Discharge Diagnoses:  Principal Problem:   Overdose Active Problems:   Acute respiratory failure (HCC)   Eating disorder    Discharge Instructions  Discharge Instructions    Diet - low sodium heart healthy    Complete by:  As directed   Increase activity slowly    Complete by:  As directed       Medication List    STOP taking these medications   amphetamine-dextroamphetamine 30 MG tablet Commonly known as:  ADDERALL   clonazePAM 2 MG tablet Commonly known as:  KLONOPIN   cyclobenzaprine 10 MG tablet Commonly known as:  FLEXERIL   furosemide 20 MG tablet Commonly known as:  LASIX   ondansetron 4 MG tablet Commonly known as:  ZOFRAN   polyethylene glycol powder powder Commonly known as:  GLYCOLAX/MIRALAX Replaced by:  polyethylene glycol packet   potassium chloride 10 MEQ tablet Commonly known as:  K-DUR   prazosin 1 MG capsule Commonly known as:  MINIPRESS   promethazine 25 MG tablet Commonly known as:  PHENERGAN   QUEtiapine 25 MG tablet Commonly known as:  SEROQUEL   QUEtiapine 400 MG tablet Commonly known as:  SEROQUEL Replaced by:  QUEtiapine 50 MG Tb24 24 hr tablet   ranitidine 150 MG  tablet Commonly known as:  ZANTAC   traMADol 50 MG tablet Commonly known as:  ULTRAM     TAKE these medications   DULoxetine 30 MG capsule Commonly known as:  CYMBALTA Take 1 capsule (30 mg total) by mouth daily. What changed:  medication strength  how much to take  when to take this    feeding supplement (ENSURE ENLIVE) Liqd Take 237 mLs by mouth 2 (two) times daily between meals.   folic acid 1 MG tablet Commonly known as:  FOLVITE Take 1 tablet (1 mg total) by mouth daily.   LINZESS 145 MCG Caps capsule Generic drug:  linaclotide Take 145 mcg by mouth daily.   Melatonin 3 MG Tabs Take 4 tablets by mouth at bedtime.   meloxicam 7.5 MG tablet Commonly known as:  MOBIC Take 7.5 mg by mouth daily.   polyethylene glycol packet Commonly known as:  MIRALAX / GLYCOLAX Take 17 g by mouth 2 (two) times daily. Replaces:  polyethylene glycol powder powder   QUEtiapine 50 MG Tb24 24 hr tablet Commonly known as:  SEROQUEL XR Take 2 tablets (100 mg total) by mouth at bedtime. Replaces:  QUEtiapine 400 MG tablet   thiamine 100 MG tablet Take 1 tablet (100 mg total) by mouth daily.   traZODone 100 MG tablet Commonly known as:  DESYREL Take 100 mg by mouth at bedtime.      Follow-up Information    [facility] Follow up in 1 week(s).   Specialty:  Endocrinology Contact information: [address] Laurell Josephs [address] [place] [address] [phone]          Allergies  Allergen Reactions  . Lyrica [Pregabalin] Other (See Comments)    Weight gain    Consultations:  Psych    Procedures/Studies: Ct Head Wo Contrast  Result Date: 02/13/2016 CLINICAL DATA:  Acute onset of slurred speech and unresponsiveness EXAM: CT HEAD WITHOUT CONTRAST TECHNIQUE: Contiguous axial images were obtained from the base of the skull through the vertex without intravenous contrast. COMPARISON:  08/18/2014 FINDINGS: The bony calvarium is intact. No findings to suggest acute hemorrhage, acute infarction or space-occupying mass lesion are noted. IMPRESSION: No acute intracranial abnormality noted. Electronically Signed   By: Alcide Clever M.D.   On: 02/13/2016 17:44  Dg Chest Port 1 View  Result Date: 02/14/2016 CLINICAL DATA:  Respiratory failure. EXAM: PORTABLE CHEST 1  VIEW COMPARISON:  Radiograph of February 13, 2016. FINDINGS: The heart size and mediastinal contours are within normal limits. Both lungs are clear. No pneumothorax or pleural effusion is noted. Endotracheal and nasogastric tubes are in grossly good position. The visualized skeletal structures are unremarkable. IMPRESSION: Stable support apparatus. No acute cardiopulmonary abnormality seen. Electronically Signed   By: Lupita Raider, M.D.   On: 02/14/2016 07:08  Dg Chest Port 1 View  Result Date: 02/13/2016 CLINICAL DATA:  Ventilator dependence. EXAM: PORTABLE CHEST 1 VIEW COMPARISON:  Earlier the same day FINDINGS: 1924 hours. Endotracheal tube tip is 4.2 cm above the base of the carina. Slight increase in bibasilar atelectasis with pneumonia not excluded at the left base on this exam. The cardiopericardial silhouette is within normal limits for size. The NG tube passes into the stomach although the distal tip position is not included on the film. Telemetry leads overlie the chest. IMPRESSION: Endotracheal tube tip 4.2 cm above the base of the carina. Slight increase in left base atelectasis with pneumonia not completely excluded by imaging. Electronically Signed   By: Minerva Areola  Molli Posey M.D.   On: 02/13/2016 19:33  Dg Chest Portable 1 View  Result Date: 02/13/2016 CLINICAL DATA:  Altered mental status. EXAM: PORTABLE CHEST 1 VIEW COMPARISON:  08/18/2014 FINDINGS: 1643 hours. Basilar atelectasis, right greater than left. No focal airspace consolidation or overt pulmonary edema. No pleural effusion. The cardiopericardial silhouette is within normal limits for size. Patient is status post T12-L2 fusion. Telemetry leads overlie the chest. IMPRESSION: Basilar atelectasis without acute findings. Electronically Signed   By: Kennith Center M.D.   On: 02/13/2016 16:52   Subjective:   Discharge Exam: Vitals:   02/24/16 2108 02/25/16 0555  BP: 116/71 (!) 90/59  Pulse: 78 94  Resp: 18   Temp: 97.4 F (36.3 C) 98.2  F (36.8 C)   Vitals:   02/24/16 1404 02/24/16 2108 02/25/16 0447 02/25/16 0555  BP: 125/75 116/71  (!) 90/59  Pulse: 91 78  94  Resp:  18    Temp: 98.3 F (36.8 C) 97.4 F (36.3 C)  98.2 F (36.8 C)  TempSrc: Oral Oral  Oral  SpO2: 100% 100%  99%  Weight:   53.5 kg (118 lb)   Height:        General: Pt is alert, awake, not in acute distress Cardiovascular: RRR, S1/S2 +, no rubs, no gallops Respiratory: CTA bilaterally, no wheezing, no rhonchi Abdominal: Soft, NT, ND, bowel sounds + Extremities: no edema, no cyanosis    The results of significant diagnostics from this hospitalization (including imaging, microbiology, ancillary and laboratory) are listed below for reference.     Microbiology: No results found for this or any previous visit (from the past 240 hour(s)).   Labs: BNP (last 3 results) No results for input(s): BNP in the last 8760 hours. Basic Metabolic Panel:  Recent Labs Lab 02/21/16 1442  NA 138  K 4.2  CL 104  CO2 29  GLUCOSE 81  BUN 6  CREATININE 0.94  CALCIUM 9.3  MG 2.0  PHOS 3.6   Liver Function Tests:  Recent Labs Lab 02/21/16 1442  AST 19  ALT 15  ALKPHOS 57  BILITOT 0.2*  PROT 7.3  ALBUMIN 3.7    Recent Labs Lab 02/20/16 1203  LIPASE 45  AMYLASE 105*   No results for input(s): AMMONIA in the last 168 hours. CBC:  Recent Labs Lab 02/21/16 1442  WBC 6.8  NEUTROABS 5.0  HGB 12.6  HCT 39.2  MCV 90.1  PLT 434*   Cardiac Enzymes: No results for input(s): CKTOTAL, CKMB, CKMBINDEX, TROPONINI in the last 168 hours. BNP: Invalid input(s): POCBNP CBG: No results for input(s): GLUCAP in the last 168 hours. D-Dimer No results for input(s): DDIMER in the last 72 hours. Hgb A1c No results for input(s): HGBA1C in the last 72 hours. Lipid Profile No results for input(s): CHOL, HDL, LDLCALC, TRIG, CHOLHDL, LDLDIRECT in the last 72 hours. Thyroid function studies No results for input(s): TSH, T4TOTAL, T3FREE, THYROIDAB  in the last 72 hours.  Invalid input(s): FREET3 Anemia work up No results for input(s): VITAMINB12, FOLATE, FERRITIN, TIBC, IRON, RETICCTPCT in the last 72 hours. Urinalysis    Component Value Date/Time   COLORURINE YELLOW 02/13/2016 1718   APPEARANCEUR CLEAR 02/13/2016 1718   LABSPEC 1.008 02/13/2016 1718   PHURINE 7.0 02/13/2016 1718   GLUCOSEU NEGATIVE 02/13/2016 1718   HGBUR LARGE (A) 02/13/2016 1718   BILIRUBINUR NEGATIVE 02/13/2016 1718   KETONESUR NEGATIVE 02/13/2016 1718   PROTEINUR NEGATIVE 02/13/2016 1718   UROBILINOGEN 0.2 10/10/2012 2012  NITRITE NEGATIVE 02/13/2016 1718   LEUKOCYTESUR LARGE (A) 02/13/2016 1718   Sepsis Labs Invalid input(s): PROCALCITONIN,  WBC,  LACTICIDVEN Microbiology No results found for this or any previous visit (from the past 240 hour(s)).   Time coordinating discharge: Over 30 minutes  SIGNED:   Alba Coryegalado, Shanette Tamargo A, MD  [facility] 02/25/2016, 9:07 AM Pager [phone]  If 7PM-7AM, please contact night-coverage [email or web address] Password [email or web address]

## 2016-02-25 NOTE — Progress Notes (Signed)
Patient ID: Theodoro KosJessica Stencel, female   DOB: [birth year], 30 y.o.   MRN: [id number]  Case discussed with Dr. Sunnie Nielsenegalado and unit LCSW.   Patient has been on IVC and continuous observation secondary to acute medical problems on arrival to the hospital and possible psych admission. Patient has lack of progress in out-patient care and recent hospitalization.  Patient has a risk to elopement even though denied SI/HI and psychosis. She has been showing improvement in her mood and oral intake during her hospital stay and contract for safety  LCSW informed us that [facility] not accept patient with IVC and patient is willing to participate voluntarily and initiated personal contact with admission staff.   Recommendations: Rescind IVC as she contract for safety and able to take oral intake with limited assistance Patient can be [facility] (inpatient [facility] disorder program) by [facility] transportation Discourage transportation provided by family or private vehicles.   Jacquel Mccamish 02/25/2016 10:52 AM

## 2016-02-25 NOTE — Progress Notes (Addendum)
Medical illustratorCalled sheriff for transport to [facility]. Patient and family notified.   1035: informed by [facility] that they cannot accept patient with an IVC.

## 2017-09-01 IMAGING — CR DG CHEST 1V PORT
1 series · 1 of 1 positions shown · non-contrast
Comparison: Earlier the same day

CLINICAL DATA: Ventilator dependence.

EXAM:
PORTABLE CHEST 1 VIEW

[AP]
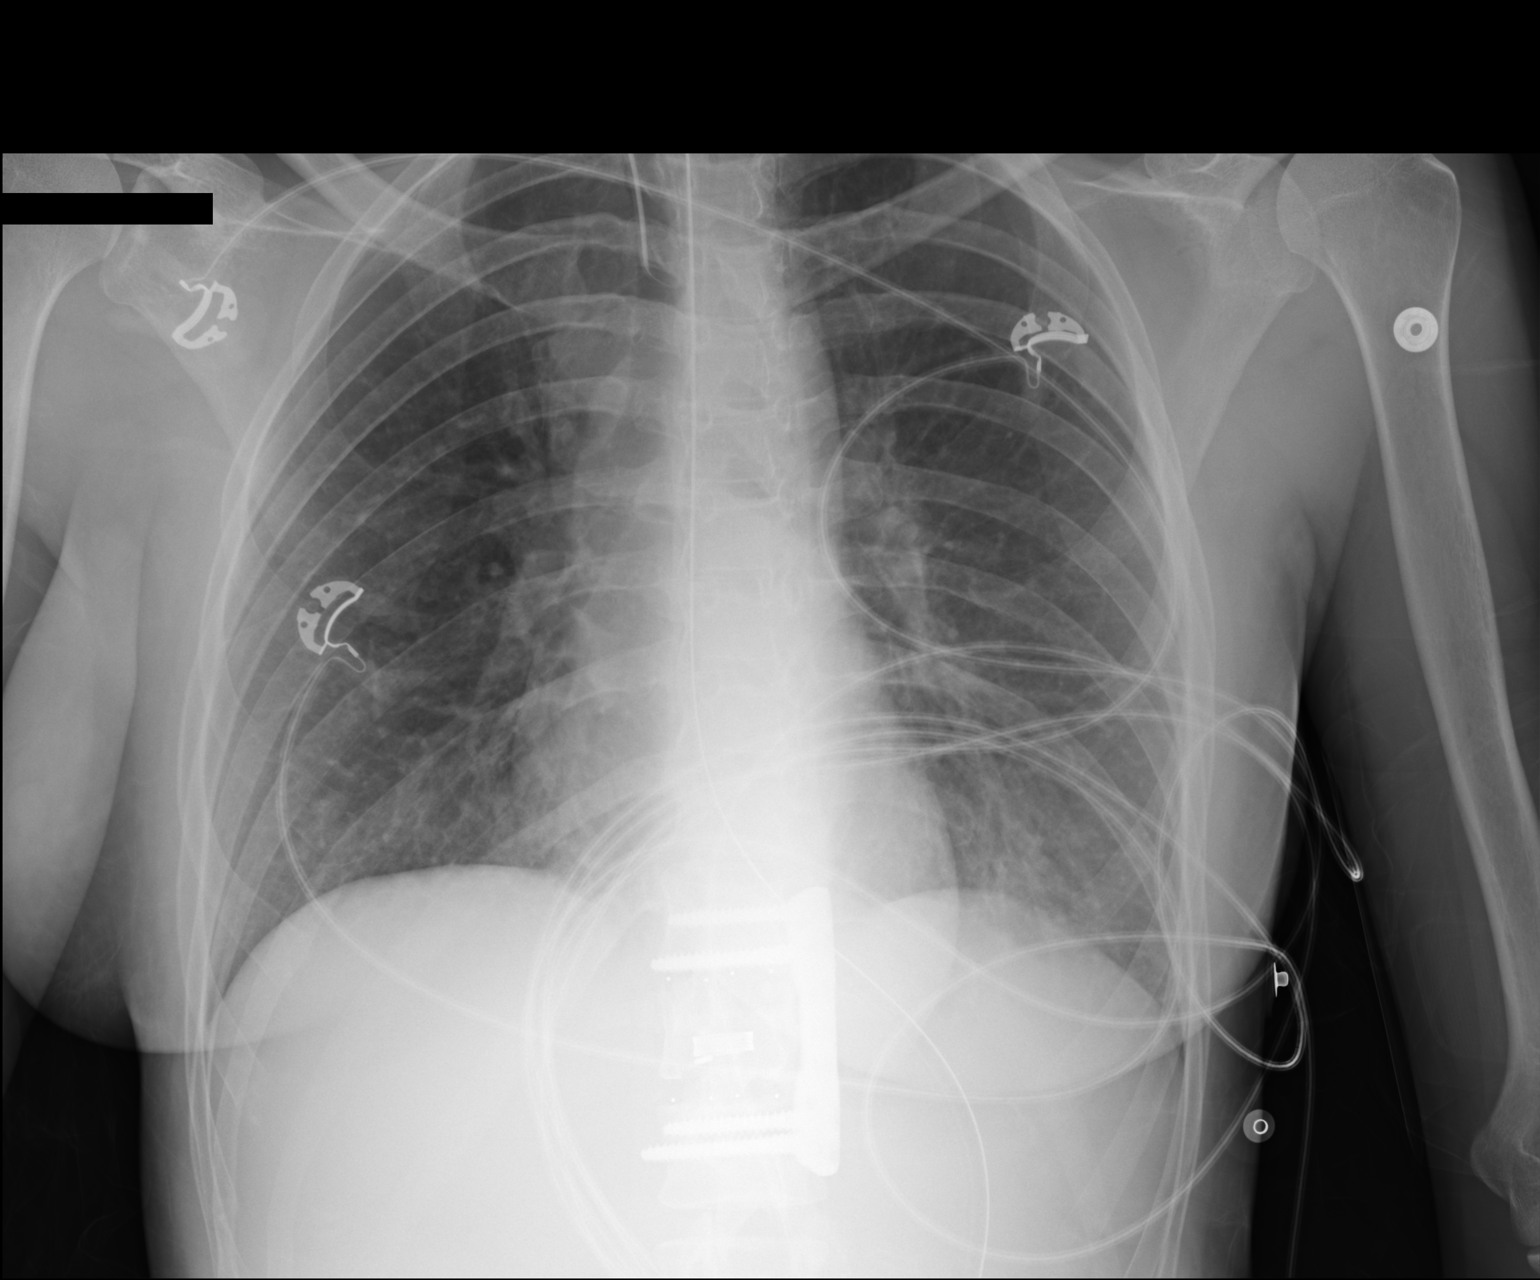

[1 of 1 positions shown; findings below may reference images not displayed]

FINDINGS: 0036 hours. Endotracheal tube tip is 4.2 cm above the base of the
carina. Slight increase in bibasilar atelectasis with pneumonia not
excluded at the left base on this exam. The cardiopericardial
silhouette is within normal limits for size. The NG tube passes into
the stomach although the distal tip position is not included on the
film. Telemetry leads overlie the chest.
IMPRESSION: Endotracheal tube tip 4.2 cm above the base of the carina.

Slight increase in left base atelectasis with pneumonia not
completely excluded by imaging.

## 2017-09-01 IMAGING — CR DG CHEST 1V PORT
1 series · 1 of 1 positions shown · non-contrast
Comparison: 08/18/2014

CLINICAL DATA: Altered mental status.

EXAM:
PORTABLE CHEST 1 VIEW

[AP]
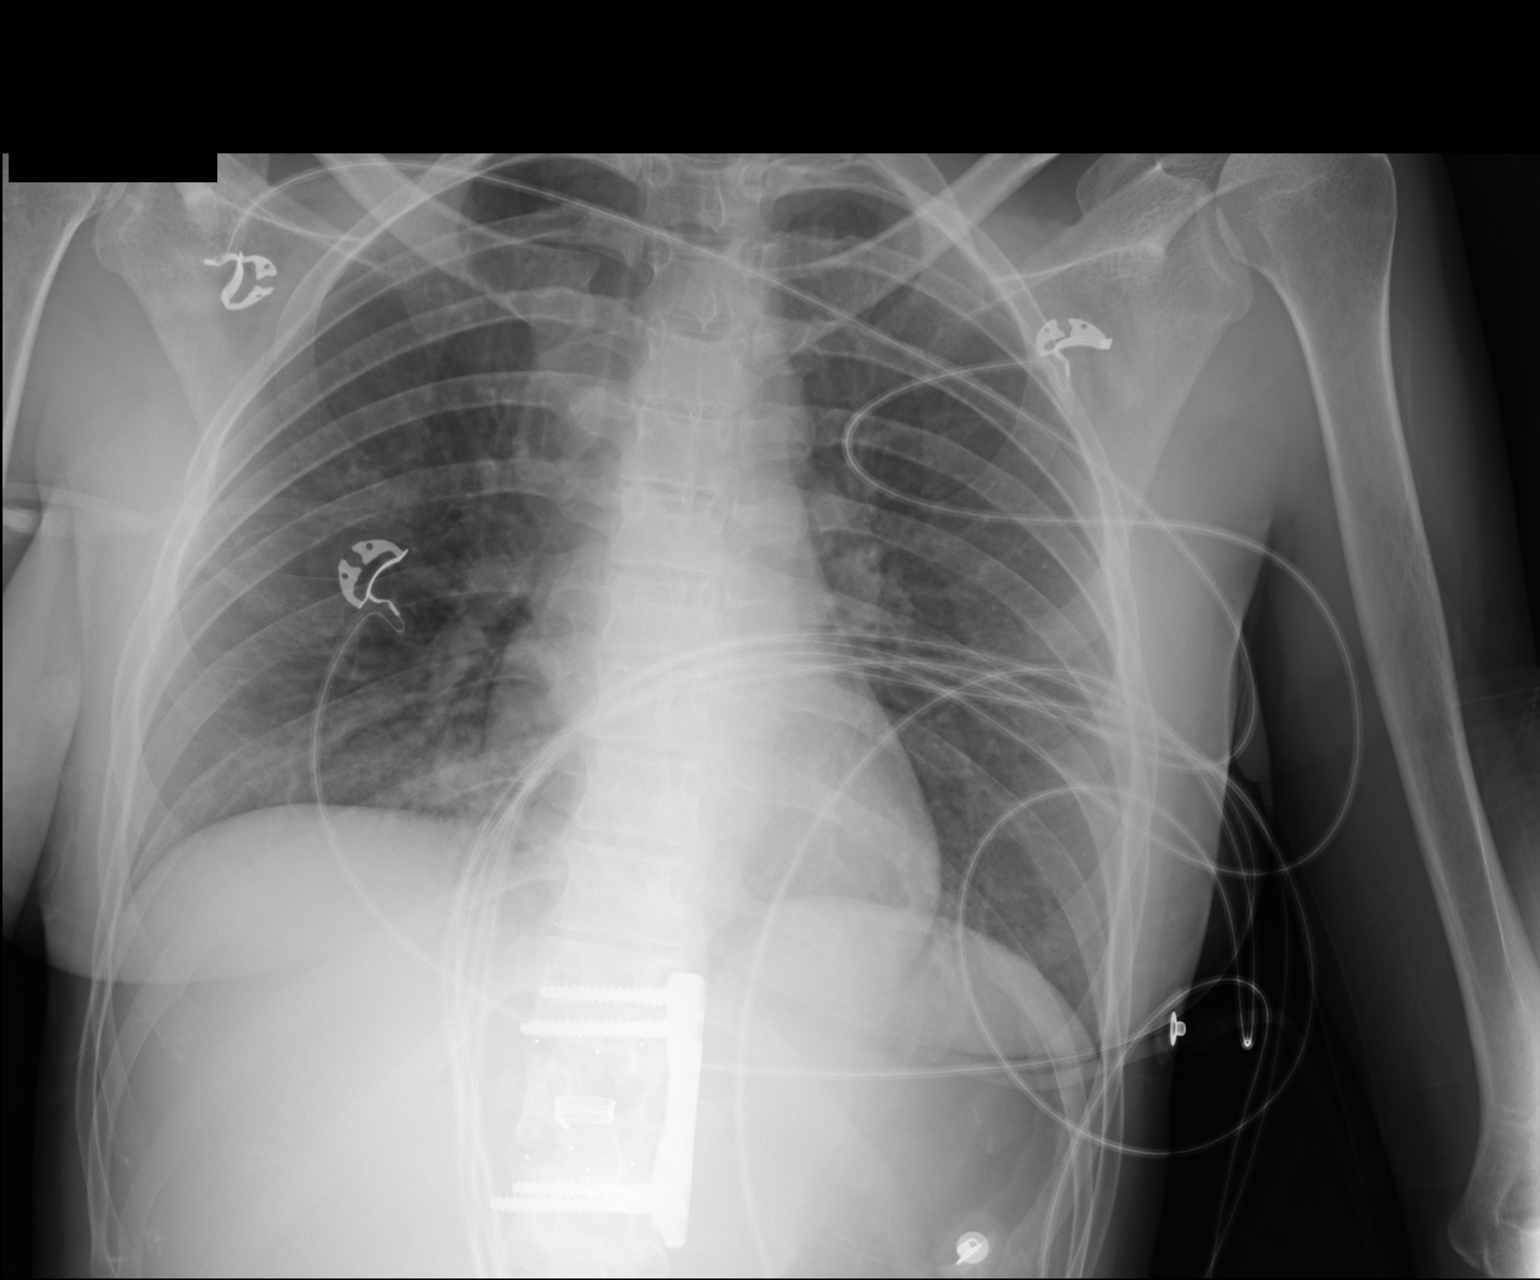

[1 of 1 positions shown; findings below may reference images not displayed]

FINDINGS: 4579 hours. Basilar atelectasis, right greater than left. No focal
airspace consolidation or overt pulmonary edema. No pleural
effusion. The cardiopericardial silhouette is within normal limits
for size. Patient is status post T12-L2 fusion. Telemetry leads
overlie the chest.
IMPRESSION: Basilar atelectasis without acute findings.

## 2017-09-02 IMAGING — DX DG CHEST 1V PORT
1 series · 1 of 1 positions shown · non-contrast
Comparison: Radiograph February 13, 2016.

CLINICAL DATA: Respiratory failure.

EXAM:
PORTABLE CHEST 1 VIEW

[chest ap]
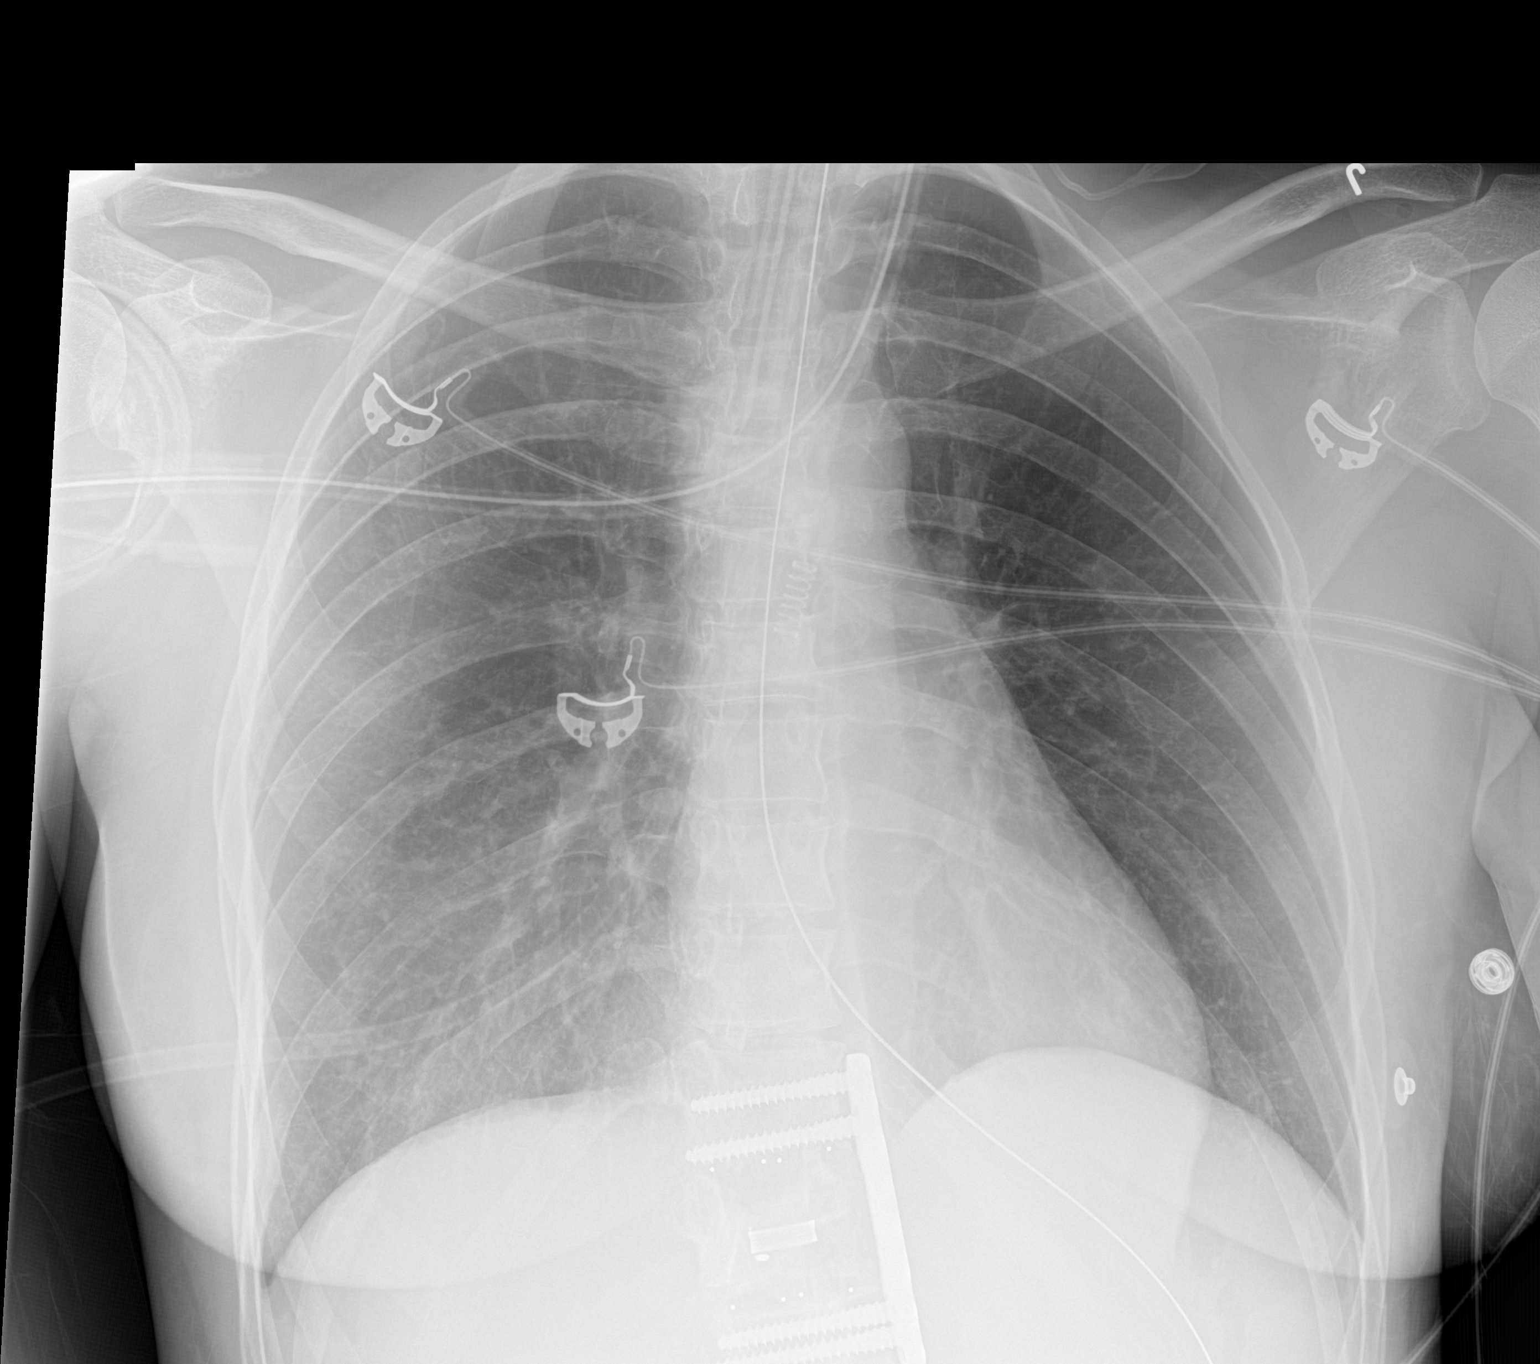

[1 of 1 positions shown; findings below may reference images not displayed]

FINDINGS: The heart size and mediastinal contours are within normal limits.
Both lungs are clear. No pneumothorax or pleural effusion is noted.
Endotracheal and nasogastric tubes are in grossly good position. The
visualized skeletal structures are unremarkable.
IMPRESSION: Stable support apparatus. No acute cardiopulmonary abnormality seen.
